# Patient Record
Sex: Male | Born: 1980 | Race: White | Hispanic: No | Marital: Single | Smoking: Current every day smoker
Health system: Southern US, Community
[De-identification: ages and names within clinical notes are randomized; demographics above are authoritative.]

## PROBLEM LIST (undated history)

## (undated) HISTORY — PX: SHOULDER SURGERY: SHX246

---

## 2006-10-13 ENCOUNTER — Emergency Department (HOSPITAL_COMMUNITY): Admission: EM | Admit: 2006-10-13 | Discharge: 2006-10-14 | Payer: Self-pay | Admitting: Emergency Medicine

## 2009-01-03 ENCOUNTER — Emergency Department (HOSPITAL_COMMUNITY): Admission: EM | Admit: 2009-01-03 | Discharge: 2009-01-03 | Payer: Self-pay | Admitting: Family Medicine

## 2009-07-07 ENCOUNTER — Emergency Department (HOSPITAL_COMMUNITY): Admission: EM | Admit: 2009-07-07 | Discharge: 2009-07-07 | Payer: Self-pay | Admitting: Family Medicine

## 2010-01-11 ENCOUNTER — Emergency Department (HOSPITAL_COMMUNITY): Admission: EM | Admit: 2010-01-11 | Discharge: 2010-01-11 | Payer: Self-pay | Admitting: Emergency Medicine

## 2010-01-15 ENCOUNTER — Encounter: Admission: RE | Admit: 2010-01-15 | Discharge: 2010-01-15 | Payer: Self-pay | Admitting: Internal Medicine

## 2013-07-28 ENCOUNTER — Emergency Department (HOSPITAL_COMMUNITY)
Admission: EM | Admit: 2013-07-28 | Discharge: 2013-07-28 | Disposition: A | Discharge status: Home / Self Care | Payer: BC Managed Care – PPO | Attending: Emergency Medicine | Admitting: Emergency Medicine

## 2013-07-28 ENCOUNTER — Encounter (HOSPITAL_COMMUNITY): Payer: Self-pay | Admitting: Emergency Medicine

## 2013-07-28 ENCOUNTER — Emergency Department (HOSPITAL_COMMUNITY): Payer: BC Managed Care – PPO

## 2013-07-28 DIAGNOSIS — S0180XA Unspecified open wound of other part of head, initial encounter: Secondary | ICD-10-CM | POA: Insufficient documentation

## 2013-07-28 DIAGNOSIS — F17200 Nicotine dependence, unspecified, uncomplicated: Secondary | ICD-10-CM | POA: Insufficient documentation

## 2013-07-28 DIAGNOSIS — S0993XA Unspecified injury of face, initial encounter: Secondary | ICD-10-CM | POA: Insufficient documentation

## 2013-07-28 DIAGNOSIS — S01112A Laceration without foreign body of left eyelid and periocular area, initial encounter: Secondary | ICD-10-CM

## 2013-07-28 DIAGNOSIS — Y9389 Activity, other specified: Secondary | ICD-10-CM | POA: Insufficient documentation

## 2013-07-28 DIAGNOSIS — R519 Headache, unspecified: Secondary | ICD-10-CM

## 2013-07-28 DIAGNOSIS — Y92410 Unspecified street and highway as the place of occurrence of the external cause: Secondary | ICD-10-CM | POA: Insufficient documentation

## 2013-07-28 MED ORDER — IBUPROFEN 800 MG PO TABS: 800.0000 mg | ORAL_TABLET | Freq: Once | ORAL | Status: DC

## 2013-07-28 NOTE — ED Provider Notes (Signed)
Medical screening examination/treatment/procedure(s) were performed by non-physician practitioner and as supervising physician I was immediately available for consultation/collaboration.  EKG Interpretation   None         Glynn Octave, MD 07/28/13 [phone]
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
## 2013-07-28 NOTE — ED Notes (Signed)
Patient with laceration above left eye and swollen cheek.  Vision: Left eye: 20/25 Right eye: 20/30 Both eyes: 20/20

## 2015-04-07 ENCOUNTER — Ambulatory Visit (INDEPENDENT_AMBULATORY_CARE_PROVIDER_SITE_OTHER): Payer: BLUE CROSS/BLUE SHIELD

## 2015-04-07 ENCOUNTER — Ambulatory Visit (INDEPENDENT_AMBULATORY_CARE_PROVIDER_SITE_OTHER): Payer: BLUE CROSS/BLUE SHIELD | Admitting: Emergency Medicine

## 2015-04-07 VITALS — BP 140/80 | HR 82 | Temp 97.3°F | Resp 16 | Ht 69.0 in | Wt 223.0 lb

## 2015-04-07 DIAGNOSIS — M25531 Pain in right wrist: Secondary | ICD-10-CM | POA: Diagnosis not present

## 2015-04-07 DIAGNOSIS — M654 Radial styloid tenosynovitis [de Quervain]: Secondary | ICD-10-CM

## 2015-04-07 MED ORDER — NAPROXEN SODIUM 550 MG PO TABS: 550.0000 mg | ORAL_TABLET | Freq: Two times a day (BID) | ORAL | Status: AC

## 2015-04-07 NOTE — Progress Notes (Signed)
PROCEDURE NOTE: Thumb Spica Splint Verbal consent obtained. A 3" x 8" thumb spica splint was applied to patient's right thumb with wrist in slightly dorsiflexed position. Secured with 2 Ace wraps. Patient tolerated this well.  Wallis Bamberg, PA-C [facility] [phone] 04/07/2015  10:43 AM

## 2015-04-07 NOTE — Patient Instructions (Signed)
De Quervain's Tenosynovitis De Quervain's tenosynovitis involves inflammation of one or two tendon linings (sheaths) or strain of one or two tendons to the thumb: extensor pollicis brevis (EPB), or abductor pollicis longus (APL). This causes pain on the side of the wrist and base of the thumb. Tendon sheaths secrete a fluid that lubricates the tendon, allowing the tendon to move smoothly. When the sheath becomes inflamed, the tendon cannot move freely in the sheath. Both the EPB and APL tendons are important for proper use of the hand. The EPB tendon is important for straightening the thumb. The APL tendon is important for moving the thumb away from the index finger (abducting). The two tendons pass through a small tube (canal) in the wrist, near the base of the thumb. When the tendons become inflamed, pain is usually felt in this area. SYMPTOMS   Pain, tenderness, swelling, warmth, or redness over the base of the thumb and thumb side of the wrist.  Pain that gets worse when straightening the thumb.  Pain that gets worse when moving the thumb away from the index finger, against resistance.  Pain with pinching or gripping.  Locking or catching of the thumb.  Limited motion of the thumb.  Crackling sound (crepitation) when the tendon or thumb is moved or touched.  Fluid-filled cyst in the area of the base of the thumb. CAUSES   Tenosynovitis is often linked with overuse of the wrist.  Tenosynovitis may be caused by repeated injury to the thumb muscle and tendon units, and with repeated motions of the hand and wrist, due to friction of the tendon within the lining (sheath).  Tenosynovitis may also be due to a sudden increase in activity or change in activity. RISK INCREASES WITH:  Sports that involve repeated hand and wrist motions (golf, bowling, tennis, squash, racquetball).  Heavy labor.  Poor physical wrist strength and flexibility.  Failure to warm up properly before practice or  play.  Male gender.  New mothers who hold their baby's head for long periods or lift infants with thumbs in the infant's armpit (axilla). PREVENTION  Warm up and stretch properly before practice or competition.  Allow enough time for rest and recovery between practices and competition.  Maintain appropriate conditioning:  Cardiovascular fitness.  Forearm, wrist, and hand flexibility.  Muscle strength and endurance.  Use proper exercise technique. PROGNOSIS  This condition is usually curable within 6 weeks, if treated properly with non-surgical treatment and resting of the affected area.  RELATED COMPLICATIONS   Longer healing time if not properly treated or if not given enough time to heal.  Chronic inflammation, causing recurring symptoms of tenosynovitis. Permanent pain or restriction of movement.  Risks of surgery: infection, bleeding, injury to nerves (numbness of the thumb), continued pain, incomplete release of the tendon sheath, recurring symptoms, cutting of the tendons, tendons sliding out of position, weakness of the thumb, thumb stiffness. TREATMENT  First, treatment involves the use of medicine and ice, to reduce pain and inflammation. Patients are encouraged to stop or modify activities that aggravate the injury. Stretching and strengthening exercises may be advised. Exercises may be completed at home or with a therapist. You may be fitted with a brace or splint, to limit motion and allow the injury to heal. Your caregiver may also choose to give you a corticosteroid injection, to reduce the pain and inflammation. If non-surgical treatment is not successful, surgery may be needed. Most tenosynovitis surgeries are done as outpatient procedures (you go home the   same day). Surgery may involve local, regional (whole arm), or general anesthesia.  MEDICATION   If pain medicine is needed, nonsteroidal anti-inflammatory medicines (aspirin and ibuprofen), or other minor pain  relievers (acetaminophen), are often advised.  Do not take pain medicine for 7 days before surgery.  Prescription pain relievers are often prescribed only after surgery. Use only as directed and only as much as you need.  Corticosteroid injections may be given if your caregiver thinks they are needed. There is a limited number of times these injections may be given. COLD THERAPY   Cold treatment (icing) should be applied for 10 to 15 minutes every 2 to 3 hours for inflammation and pain, and immediately after activity that aggravates your symptoms. Use ice packs or an ice massage. SEEK MEDICAL CARE IF:   Symptoms get worse or do not improve in 2 to 4 weeks, despite treatment.  You experience pain, numbness, or coldness in the hand.  Blue, gray, or dark color appears in the fingernails.  Any of the following occur after surgery: increased pain, swelling, redness, drainage of fluids, bleeding in the affected area, or signs of infection.  New, unexplained symptoms develop. (Drugs used in treatment may produce side effects.) Document Released: 07/22/2005 Document Revised: 10/14/2011 Document Reviewed: 11/03/2008 ExitCare Patient Information 2015 [employer]. This information is not intended to replace advice given to you by your health care provider. Make sure you discuss any questions you have with your health care provider.   
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
## 2015-04-09 ENCOUNTER — Ambulatory Visit (INDEPENDENT_AMBULATORY_CARE_PROVIDER_SITE_OTHER): Payer: BLUE CROSS/BLUE SHIELD | Admitting: Family Medicine

## 2015-04-09 ENCOUNTER — Telehealth: Payer: Self-pay | Admitting: Family Medicine

## 2015-04-09 VITALS — BP 114/86 | HR 67 | Temp 97.5°F | Resp 16 | Ht 69.0 in | Wt 226.0 lb

## 2015-04-09 DIAGNOSIS — M7989 Other specified soft tissue disorders: Secondary | ICD-10-CM

## 2015-04-09 DIAGNOSIS — M654 Radial styloid tenosynovitis [de Quervain]: Secondary | ICD-10-CM | POA: Diagnosis not present
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
## 2015-04-09 NOTE — Telephone Encounter (Signed)
Patient called answering service sayingthat ace wrap slpint was causing his thumb and wrist to swell. Advise to take off splint, he can come into the office tomorrow for a thumb spica splint if he has dequervain's tendonitis. If the swelling continues with elevation overnight then he needs to check in for an office visit otherwise he can just pick up a splint since the hand made one in office was actually helping. He denies n/w/t, he just can't close his hand due to swelling. No fevers or chills.

## 2015-04-09 NOTE — Patient Instructions (Signed)
De Quervain's Tenosynovitis De Quervain's tenosynovitis involves inflammation of one or two tendon linings (sheaths) or strain of one or two tendons to the thumb: extensor pollicis brevis (EPB), or abductor pollicis longus (APL). This causes pain on the side of the wrist and base of the thumb. Tendon sheaths secrete a fluid that lubricates the tendon, allowing the tendon to move smoothly. When the sheath becomes inflamed, the tendon cannot move freely in the sheath. Both the EPB and APL tendons are important for proper use of the hand. The EPB tendon is important for straightening the thumb. The APL tendon is important for moving the thumb away from the index finger (abducting). The two tendons pass through a small tube (canal) in the wrist, near the base of the thumb. When the tendons become inflamed, pain is usually felt in this area. SYMPTOMS   Pain, tenderness, swelling, warmth, or redness over the base of the thumb and thumb side of the wrist.  Pain that gets worse when straightening the thumb.  Pain that gets worse when moving the thumb away from the index finger, against resistance.  Pain with pinching or gripping.  Locking or catching of the thumb.  Limited motion of the thumb.  Crackling sound (crepitation) when the tendon or thumb is moved or touched.  Fluid-filled cyst in the area of the base of the thumb. CAUSES   Tenosynovitis is often linked with overuse of the wrist.  Tenosynovitis may be caused by repeated injury to the thumb muscle and tendon units, and with repeated motions of the hand and wrist, due to friction of the tendon within the lining (sheath).  Tenosynovitis may also be due to a sudden increase in activity or change in activity. RISK INCREASES WITH:  Sports that involve repeated hand and wrist motions (golf, bowling, tennis, squash, racquetball).  Heavy labor.  Poor physical wrist strength and flexibility.  Failure to warm up properly before practice or  play.  Male gender.  New mothers who hold their baby's head for long periods or lift infants with thumbs in the infant's armpit (axilla). PREVENTION  Warm up and stretch properly before practice or competition.  Allow enough time for rest and recovery between practices and competition.  Maintain appropriate conditioning:  Cardiovascular fitness.  Forearm, wrist, and hand flexibility.  Muscle strength and endurance.  Use proper exercise technique. PROGNOSIS  This condition is usually curable within 6 weeks, if treated properly with non-surgical treatment and resting of the affected area.  RELATED COMPLICATIONS   Longer healing time if not properly treated or if not given enough time to heal.  Chronic inflammation, causing recurring symptoms of tenosynovitis. Permanent pain or restriction of movement.  Risks of surgery: infection, bleeding, injury to nerves (numbness of the thumb), continued pain, incomplete release of the tendon sheath, recurring symptoms, cutting of the tendons, tendons sliding out of position, weakness of the thumb, thumb stiffness. TREATMENT  First, treatment involves the use of medicine and ice, to reduce pain and inflammation. Patients are encouraged to stop or modify activities that aggravate the injury. Stretching and strengthening exercises may be advised. Exercises may be completed at home or with a therapist. You may be fitted with a brace or splint, to limit motion and allow the injury to heal. Your caregiver may also choose to give you a corticosteroid injection, to reduce the pain and inflammation. If non-surgical treatment is not successful, surgery may be needed. Most tenosynovitis surgeries are done as outpatient procedures (you go home the   same day). Surgery may involve local, regional (whole arm), or general anesthesia.  MEDICATION   If pain medicine is needed, nonsteroidal anti-inflammatory medicines (aspirin and ibuprofen), or other minor pain  relievers (acetaminophen), are often advised.  Do not take pain medicine for 7 days before surgery.  Prescription pain relievers are often prescribed only after surgery. Use only as directed and only as much as you need.  Corticosteroid injections may be given if your caregiver thinks they are needed. There is a limited number of times these injections may be given. COLD THERAPY   Cold treatment (icing) should be applied for 10 to 15 minutes every 2 to 3 hours for inflammation and pain, and immediately after activity that aggravates your symptoms. Use ice packs or an ice massage. SEEK MEDICAL CARE IF:   Symptoms get worse or do not improve in 2 to 4 weeks, despite treatment.  You experience pain, numbness, or coldness in the hand.  Blue, gray, or dark color appears in the fingernails.  Any of the following occur after surgery: increased pain, swelling, redness, drainage of fluids, bleeding in the affected area, or signs of infection.  New, unexplained symptoms develop. (Drugs used in treatment may produce side effects.) Document Released: 07/22/2005 Document Revised: 10/14/2011 Document Reviewed: 11/03/2008 ExitCare Patient Information 2015 [employer]. This information is not intended to replace advice given to you by your health care provider. Make sure you discuss any questions you have with your health care provider.   

## 2015-04-19 ENCOUNTER — Telehealth: Payer: Self-pay

## 2015-04-19 NOTE — Telephone Encounter (Signed)
Pt has an upcoming appt with specialist for wrist on September 22,Is he to continue wearing brace until that appt and if so, He will need note for work for restrictions etc   Best phone for pt is [phone]

## 2015-04-21 NOTE — Telephone Encounter (Signed)
Please advise 

## 2015-04-21 NOTE — Telephone Encounter (Signed)
I apologize, I meant to send this message 2 days ago. Something happened. Please advise.

## 2015-04-22 NOTE — Telephone Encounter (Signed)
Call for update --- how is patient's wrist pain?  Is it better, the same, or worse?

## 2015-04-24 NOTE — Telephone Encounter (Signed)
Left message for pt to call back  °

## 2015-04-25 NOTE — Telephone Encounter (Signed)
Call --- if wrist continues to hurt atleast every other day, I would continue using the wrist splint while at work. If wrist rarely hurts now, then would not always use wrist splint.

## 2015-04-25 NOTE — Telephone Encounter (Signed)
Spoke with pt, he states his wrist is better but started hurting last night at work. He just wanted to know if he had to use his brace until his appt with ortho. He has an appt on Thursday.

## 2015-04-26 NOTE — Telephone Encounter (Signed)
Spoke with Pt. And he is going to continue to wear his wrist splint at work. He did state he still in pain, and will be going to his appt. Tomorrow at ortho.

## 2016-03-13 ENCOUNTER — Emergency Department (HOSPITAL_COMMUNITY)
Admission: EM | Admit: 2016-03-13 | Discharge: 2016-03-13 | Disposition: A | Discharge status: Home / Self Care | Payer: Worker's Compensation | Attending: Emergency Medicine | Admitting: Emergency Medicine

## 2016-03-13 ENCOUNTER — Emergency Department (HOSPITAL_COMMUNITY): Payer: Worker's Compensation

## 2016-03-13 ENCOUNTER — Encounter (HOSPITAL_COMMUNITY): Payer: Self-pay

## 2016-03-13 DIAGNOSIS — Y939 Activity, unspecified: Secondary | ICD-10-CM | POA: Insufficient documentation

## 2016-03-13 DIAGNOSIS — S61231A Puncture wound without foreign body of left index finger without damage to nail, initial encounter: Secondary | ICD-10-CM | POA: Insufficient documentation

## 2016-03-13 DIAGNOSIS — S6992XA Unspecified injury of left wrist, hand and finger(s), initial encounter: Secondary | ICD-10-CM

## 2016-03-13 DIAGNOSIS — F17210 Nicotine dependence, cigarettes, uncomplicated: Secondary | ICD-10-CM | POA: Diagnosis not present

## 2016-03-13 DIAGNOSIS — Z23 Encounter for immunization: Secondary | ICD-10-CM | POA: Insufficient documentation

## 2016-03-13 DIAGNOSIS — Y929 Unspecified place or not applicable: Secondary | ICD-10-CM | POA: Diagnosis not present

## 2016-03-13 DIAGNOSIS — Y990 Civilian activity done for income or pay: Secondary | ICD-10-CM | POA: Diagnosis not present

## 2016-03-13 DIAGNOSIS — W231XXA Caught, crushed, jammed, or pinched between stationary objects, initial encounter: Secondary | ICD-10-CM | POA: Diagnosis not present

## 2016-03-13 MED ORDER — TETANUS-DIPHTH-ACELL PERTUSSIS 5-2.5-18.5 LF-MCG/0.5 IM SUSP
0.5000 mL | Freq: Once | INTRAMUSCULAR | Status: AC
  Administered 2016-03-13: 0.5 mL via INTRAMUSCULAR
  Filled 2016-03-13: qty 0.5

## 2016-03-13 MED ORDER — SULFAMETHOXAZOLE-TRIMETHOPRIM 800-160 MG PO TABS: 10.0000 | ORAL_TABLET | Freq: Two times a day (BID) | ORAL | 0 refills | Status: AC

## 2016-03-13 MED ORDER — SULFAMETHOXAZOLE-TRIMETHOPRIM 800-160 MG PO TABS: 1.0000 | ORAL_TABLET | Freq: Two times a day (BID) | ORAL | 0 refills | Status: AC

## 2016-03-13 NOTE — ED Triage Notes (Signed)
Pt here for crush injury to left pointer finger at work when reel fell on it.

## 2016-03-13 NOTE — ED Provider Notes (Signed)
[facility] Provider Note   CSN: [id number] Arrival date & time: 03/13/16  4098  First Provider Contact:  None       History   Chief Complaint Chief Complaint  Patient presents with  . Finger Injury    HPI JAYMARION TROMBLY is a 35 y.o. male.  Patient presents today with an injury to the left index finger.  He states that he was working with some sort of a rod approximately 2 hours prior to arrival.  He was trying to put the rod into a PVC pipe when it slipped and landed on his finger.  He does not believe that anything went through his finger or broke off inside his finger.  He denies any numbness or tingling of the finger.  He has a small puncture wound to the dorsal and palmar aspect of the fingers.  Bleeding controlled.  He is unsure of the date of his last tetanus.       History reviewed. No pertinent past medical history.  There are no active problems to display for this patient.   Past Surgical History:  Procedure Laterality Date  . SHOULDER SURGERY         Home Medications    Prior to Admission medications   Medication Sig Start Date End Date Taking? Authorizing Provider  naproxen sodium (ANAPROX DS) 550 MG tablet Take 1 tablet (550 mg total) by mouth 2 (two) times daily with a meal. 04/07/15 04/06/16  Carmelina Dane, MD    Family History History reviewed. No pertinent family history.  Social History Social History  Substance Use Topics  . Smoking status: Current Every Day Smoker    Types: Cigarettes  . Smokeless tobacco: Current User     Comment: Vape  . Alcohol use No     Allergies   Review of patient's allergies indicates no known allergies.   Review of Systems Review of Systems  All other systems reviewed and are negative.    Physical Exam Updated Vital Signs BP 117/64   Pulse 74   Temp 97.8 F (36.6 C) (Oral)   Resp 18   Ht  (1.727 m)   Wt 97.5 kg   SpO2 98%   BMI 32.69 kg/m   Physical Exam  Constitutional: He  appears well-developed and well-nourished.  HENT:  Head: Normocephalic and atraumatic.  Neck: Normal range of motion. Neck supple.  Cardiovascular: Normal rate and regular rhythm.   Pulmonary/Chest: Effort normal and breath sounds normal.  Musculoskeletal: Normal range of motion.  Full ROM of the left index finger  Neurological: He is alert.  Distal sensation of the left index finger is intact  Skin: Skin is warm and dry.  Small puncture wound to the dorsal and volar aspect of the left index finger.  No active bleeding.  No drainage.  No surrounding erythema or warmth  Psychiatric: He has a normal mood and affect.  Nursing note and vitals reviewed.    ED Treatments / Results  Labs (all labs ordered are listed, but only abnormal results are displayed) Labs Reviewed - No data to display  EKG  EKG Interpretation None       Radiology Dg Finger Index Left  Result Date: 03/13/2016 CLINICAL DATA:  Reel fell on patient's left index finger, with pain. Initial encounter. EXAM: LEFT INDEX FINGER 2+V COMPARISON:  None. FINDINGS: There is no evidence of fracture or dislocation. There appears to be a small 4 mm high-density thin fragment embedded within the  soft tissues along the volar aspect of the second distal interphalangeal joint, which may reflect a thin wire. Visualized joint spaces are grossly preserved. No additional soft tissue abnormalities are seen. IMPRESSION: 1. No evidence of fracture or dislocation. 2. Apparent small 4 mm high-density thin fragment embedded within the soft tissues along the volar aspect of the second distal interphalangeal joint, which may reflect a thin wire. Electronically Signed   By: Jeffery  Chang M.D.   On: 03/13/2016 05:Roanna [id number]    Procedures Procedures (including critical care time)  Medications Ordered in ED Medications  Tdap (BOOSTRIX) injection 0.5 mL (not administered)     Initial Impression / Assessment and Plan / ED Course  I have reviewed the  triage vital signs and the nursing notes.  Pertinent labs & imaging results that were available during my care of the patient were reviewed by me and considered in my medical decision making (see chart for details).  Clinical Course    Patient presents today with a chief complaint of finger injury.  He states that some sort of reel fell on his finger approximately 2 hours prior to arrival.  He does have what appears to be a puncture wound to the volar and dorsal aspect of the finger.  He does not feel that anything went inside of his finger.  Xray showing small 4mm fragment in the soft tissues near the DIP. Will refer to hand.  Patient placed on antibiotic.  Tetanus updated.  Stable for discharge.  Return precautions given.    Final Clinical Impressions(s) / ED Diagnoses   Final diagnoses:  None    New Prescriptions New Prescriptions   No medications on file     Kristie CowmanHeather Killian Schwer, PA-C 03/14/16 2147    Alvira MondayErin Schlossman, MD 03/15/16 Rickey [id number]

## 2016-05-03 DIAGNOSIS — Z0000 Encounter for general adult medical examination without abnormal findings: Secondary | ICD-10-CM | POA: Diagnosis not present

## 2016-05-10 DIAGNOSIS — Z1389 Encounter for screening for other disorder: Secondary | ICD-10-CM | POA: Diagnosis not present

## 2016-05-10 DIAGNOSIS — R945 Abnormal results of liver function studies: Secondary | ICD-10-CM | POA: Diagnosis not present

## 2016-05-10 DIAGNOSIS — R030 Elevated blood-pressure reading, without diagnosis of hypertension: Secondary | ICD-10-CM | POA: Diagnosis not present

## 2016-05-10 DIAGNOSIS — M25519 Pain in unspecified shoulder: Secondary | ICD-10-CM | POA: Diagnosis not present

## 2016-05-10 DIAGNOSIS — Z0000 Encounter for general adult medical examination without abnormal findings: Secondary | ICD-10-CM | POA: Diagnosis not present

## 2016-05-10 DIAGNOSIS — J351 Hypertrophy of tonsils: Secondary | ICD-10-CM | POA: Diagnosis not present

## 2016-05-23 ENCOUNTER — Ambulatory Visit (INDEPENDENT_AMBULATORY_CARE_PROVIDER_SITE_OTHER): Payer: BLUE CROSS/BLUE SHIELD

## 2016-05-23 ENCOUNTER — Ambulatory Visit (INDEPENDENT_AMBULATORY_CARE_PROVIDER_SITE_OTHER): Payer: BLUE CROSS/BLUE SHIELD | Admitting: Podiatry

## 2016-05-23 ENCOUNTER — Encounter: Payer: Self-pay | Admitting: Podiatry

## 2016-05-23 DIAGNOSIS — S99921A Unspecified injury of right foot, initial encounter: Secondary | ICD-10-CM

## 2016-05-23 DIAGNOSIS — M779 Enthesopathy, unspecified: Secondary | ICD-10-CM

## 2016-05-23 MED ORDER — TRIAMCINOLONE ACETONIDE 10 MG/ML IJ SUSP
10.0000 mg | Freq: Once | INTRAMUSCULAR | Status: AC
  Administered 2016-05-23: 10 mg

## 2016-05-23 NOTE — Progress Notes (Signed)
   Subjective:    Patient ID: Rickey Lee, male    DOB: 05/18/1981, 35 y.o.   MRN: [id number]  HPI Chief Complaint  Patient presents with  . Foot Injury    Medial foot and anterior ankle right - patient was playing around with a self standing ladder and while climbing it felt something pop on Friday (6 days ago) and a lot of pain, iced it-no help      Review of Systems  Musculoskeletal: Positive for myalgias.  All other systems reviewed and are negative.      Objective:   Physical Exam        Assessment & Plan:

## 2016-05-29 NOTE — Progress Notes (Signed)
Subjective:     Patient ID: Joselyn ArrowKevin X Biller, male   DOB: 1980/09/13, 35 y.o.   MRN: [id number]  HPI patient presents stating that he hurt his right ankle 6 days ago and felt a pop when he was on a ladder and wanted to make sure there was no damage   Review of Systems  All other systems reviewed and are negative.      Objective:   Physical Exam  Constitutional: He is oriented to person, place, and time.  Cardiovascular: Intact distal pulses.   Musculoskeletal: Normal range of motion.  Neurological: He is oriented to person, place, and time.  Skin: Skin is warm.  Nursing note and vitals reviewed.  neurovascular status was intact muscle strength was adequate range of motion within normal limits with discomfort in the lateral ankle area posteriorly of a mild nature. I checked all ligaments and all muscles and found everything to be intact and do not think that there is a tear but I do think that there is an inflammatory process. It is localized in nature with patient having good digital perfusion and well oriented 3     Assessment:     Probable inflammatory tendinitis which may be from injury or more localized process    Plan:     H&P x-rays reviewed and advised on sheath injection which was administered along with bracing to help stabilize the area and aggressive ice. Reappoint next 3 weeks to recheck  X-rays were negative for signs of fracture or arthritic process

## 2016-06-29 DIAGNOSIS — H52223 Regular astigmatism, bilateral: Secondary | ICD-10-CM | POA: Diagnosis not present

## 2017-03-06 ENCOUNTER — Encounter (HOSPITAL_COMMUNITY): Payer: Self-pay | Admitting: Emergency Medicine

## 2017-03-06 DIAGNOSIS — F17210 Nicotine dependence, cigarettes, uncomplicated: Secondary | ICD-10-CM | POA: Diagnosis not present

## 2017-03-06 DIAGNOSIS — Y9289 Other specified places as the place of occurrence of the external cause: Secondary | ICD-10-CM | POA: Insufficient documentation

## 2017-03-06 DIAGNOSIS — S6991XA Unspecified injury of right wrist, hand and finger(s), initial encounter: Secondary | ICD-10-CM | POA: Diagnosis present

## 2017-03-06 DIAGNOSIS — Y990 Civilian activity done for income or pay: Secondary | ICD-10-CM | POA: Insufficient documentation

## 2017-03-06 DIAGNOSIS — W230XXA Caught, crushed, jammed, or pinched between moving objects, initial encounter: Secondary | ICD-10-CM | POA: Insufficient documentation

## 2017-03-06 DIAGNOSIS — Y9389 Activity, other specified: Secondary | ICD-10-CM | POA: Diagnosis not present

## 2017-03-06 MED ORDER — IBUPROFEN 400 MG PO TABS
400.0000 mg | ORAL_TABLET | Freq: Once | ORAL | Status: AC | PRN
  Administered 2017-03-06: 400 mg via ORAL

## 2017-03-06 MED ORDER — IBUPROFEN 400 MG PO TABS
ORAL_TABLET | ORAL | Status: AC
  Filled 2017-03-06: qty 1

## 2017-03-06 NOTE — ED Triage Notes (Signed)
Pt states he got his hand stuck in a puller while at work tonight. Laceration to right pinky, bruising to right thumb.

## 2017-03-07 ENCOUNTER — Emergency Department (HOSPITAL_COMMUNITY)
Admission: EM | Admit: 2017-03-07 | Discharge: 2017-03-07 | Disposition: A | Discharge status: Home / Self Care | Payer: Worker's Compensation | Attending: Emergency Medicine | Admitting: Emergency Medicine

## 2017-03-07 ENCOUNTER — Encounter (HOSPITAL_COMMUNITY): Payer: Self-pay | Admitting: Emergency Medicine

## 2017-03-07 ENCOUNTER — Emergency Department (HOSPITAL_COMMUNITY): Payer: Worker's Compensation

## 2017-03-07 DIAGNOSIS — S6991XA Unspecified injury of right wrist, hand and finger(s), initial encounter: Secondary | ICD-10-CM

## 2017-03-07 MED ORDER — IBUPROFEN 600 MG PO TABS: 600.0000 mg | ORAL_TABLET | Freq: Four times a day (QID) | ORAL | 0 refills | Status: DC | PRN

## 2017-03-07 NOTE — Discharge Instructions (Signed)
You have been seen today for a hand injury. There were no acute abnormalities on the x-rays, including no sign of fracture or dislocation. Pain: Take 600 mg of ibuprofen every 6 hours or 440 mg (over the counter dose) to 500 mg (prescription dose) of naproxen every 12 hours or for the next 3 days. After this time, these medications may be used as needed for pain. Take these medications with food to avoid upset stomach. Choose only one of these medications, do not take them together.  Tylenol: Should you continue to have additional pain while taking the ibuprofen or naproxen, you may add in tylenol as needed. Your daily total maximum amount of tylenol from all sources should be limited to 4000mg /day for persons without liver problems, or 2000mg /day for those with liver problems. Ice: May apply ice to the area over the next 24 hours for 15 minutes at a time to reduce swelling. Elevation: Keep the extremity elevated as often as possible to reduce pain and inflammation. Exercises: Start by performing these exercises a few times a week, increasing the frequency until you are performing them twice daily.  Follow up: If symptoms are improving, you may follow up with your primary care provider for any continued management. If symptoms are not improving, you may follow up with the hand specialist.

## 2017-03-07 NOTE — ED Provider Notes (Signed)
[facility] Provider Note   CSN: [id number] Arrival date & time: 03/06/17  2324     History   Chief Complaint Chief Complaint  Patient presents with  . Hand Injury    HPI Rickey Lee is a 36 y.o. male.  HPI   Rickey ArrowKevin X Chason is a 36 y.o. male, patient with no pertinent past medical history, presenting to the ED with a right hand injury that occurred around 11pm 8/2.  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 He complains of 6/10, throbbing, nonradiating. Also complains of swelling to the fingers of the right hand.  Denies numbness, tingling, weakness, other injuries, or any other complaints.     History reviewed. No pertinent past medical history.  There are no active problems to display for this patient.   Past Surgical History:  Procedure Laterality Date  . SHOULDER SURGERY         Home Medications    Prior to Admission medications   Medication Sig Start Date End Date Taking? Authorizing Provider  ibuprofen (ADVIL,MOTRIN) 600 MG tablet Take 1 tablet (600 mg total) by mouth every 6 (six) hours as needed. 03/07/17   Joy, Hillard DankerShawn C, PA-C    Family History No family history on file.  Social History Social History  Substance Use Topics  . Smoking status: Current Every Day Smoker    Types: Cigarettes  . Smokeless tobacco: Current User     Comment: Vape  . Alcohol use No     Allergies   Patient has no known allergies.   Review of Systems Review of Systems  Musculoskeletal: Positive for arthralgias.  Skin: Positive for wound.  Neurological: Negative for weakness and numbness.     Physical Exam Updated Vital Signs BP (!) 154/104 (BP Location: Left Arm)   Pulse 74   Temp (!) 97.4 F (36.3 C) (Oral)   Resp 18   Ht 5\' 8"  (1.727 m)   Wt 95.3 kg (210 lb)   SpO2 100%   BMI 31.93 kg/m   Physical Exam  Constitutional: He appears well-developed and well-nourished. No distress.  HENT:  Head:  Normocephalic and atraumatic.  Eyes: Conjunctivae are normal.  Neck: Neck supple.  Cardiovascular: Normal rate, regular rhythm and intact distal pulses.   Pulmonary/Chest: Effort normal.  Musculoskeletal: He exhibits edema and tenderness.  Tenderness and swelling noted to the fingers of the right hand. Flexion and extension intact at the MCP, PIP, and DIP joints of each of the fingers of the right hand. Patient initially had some difficulty with this due to pain, however, once pain was controlled he was able to perform these functions without hesitation. Minor scissoring of the right small finger over the ring finger when making a fist. Full range of motion through each of the cardinal directions of the wrist.  Neurological: He is alert.  No noted sensory deficits in the digits of the right hand. Once pain was controlled, grip strength 5 out of 5. Strength is also 5 out of 5 with flexion and extension of each of the right fingers as well as abduction and abduction of the fingers.  Skin: Skin is warm and dry. Capillary refill takes less than 2 seconds. He is not diaphoretic.  1 cm, superficial skin avulsion to the ulnar surface of the right small finger.  The "foreign bodies" noted on x-ray were found to correlate with 2 very small pieces of gravel sticking to the patient's  skin. These were removed.  Psychiatric: He has a normal mood and affect. His behavior is normal.  Nursing note and vitals reviewed.          ED Treatments / Results  Labs (all labs ordered are listed, but only abnormal results are displayed) Labs Reviewed - No data to display  EKG  EKG Interpretation None       Radiology Dg Hand Complete Right  Result Date: 03/07/2017 CLINICAL DATA:  Initial evaluation for acute trauma, laceration to fingers. EXAM: RIGHT HAND - COMPLETE 3+ VIEW COMPARISON:  None available. FINDINGS: Soft tissue irregularity with swelling seen about the PIP joints of the second through fifth  digits, suspicious for soft tissue injury/ laceration. There is a small 3 mm radiopaque foreign body at the radial aspect of the right third digit, just proximal to the PIP joint. Additional punctate radiopaque density at the skin of the radial aspect of the right second digit near the base. No other radiopaque foreign body. No acute fracture dislocation. 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 IMPRESSION: 1. Soft tissue swelling with irregularity about the PIP joints of the right second through fifth digits, compatible with soft tissue injury/laceration. 2. 2 subcentimeter radiopaque densities at the radial aspects of the proximal right second and third digits as above, consistent with small foreign bodies. 3. No acute fracture or dislocation. Electronically Signed   By: Rise MuBenjamin  McClintock M.D.   On: 03/07/2017 00:36    Procedures .Nerve Block Date/Time: 03/07/2017 1:07 AM Performed by: Anselm PancoastJOY, SHAWN C Authorized by: Anselm PancoastJOY, SHAWN C   Consent:    Consent obtained:  Verbal   Consent given by:  Patient   Risks discussed:  Unsuccessful block, pain and swelling Indications:    Indications:  Pain relief Location:    Body area:  Upper extremity   Upper extremity nerve blocked: Digital.   Laterality:  Right Pre-procedure details:    Skin preparation:  Povidone-iodine Procedure details (see MAR for exact dosages):    Block needle gauge:  25 G   Anesthetic injected:  Bupivacaine 0.5% w/o epi Post-procedure details:    Outcome:  Pain relieved   Patient tolerance of procedure:  Tolerated well, no immediate complications   (including critical care time)  Medications Ordered in ED Medications  ibuprofen (ADVIL,MOTRIN) tablet 400 mg (400 mg Oral Given 03/06/17 2337)     Initial Impression / Assessment and Plan / ED Course  I have reviewed the triage vital signs and the nursing notes.  Pertinent labs & imaging results that were available during my care of the patient were reviewed by  me and considered in my medical decision making (see chart for details).     Patient presents with a right hand injury. Neurovascularly intact. Hand specialist follow-up as needed. The patient was given instructions for home care as well as return precautions. Patient voices understanding of these instructions, accepts the plan, and is comfortable with discharge.  Final Clinical Impressions(s) / ED Diagnoses   Final diagnoses:  Injury of right hand, initial encounter    New Prescriptions Discharge Medication List as of 03/07/2017  3:13 AM    START taking these medications   Details  ibuprofen (ADVIL,MOTRIN) 600 MG tablet Take 1 tablet (600 mg total) by mouth every 6 (six) hours as needed., Starting Fri 03/07/2017, Print         Joy, Shawn C, PA-C 03/07/17 0524    Ward, Layla MawKristen N, DO 03/07/17 [id number]

## 2017-06-14 DIAGNOSIS — H52223 Regular astigmatism, bilateral: Secondary | ICD-10-CM | POA: Diagnosis not present

## 2017-08-05 IMAGING — DX DG HAND COMPLETE 3+V*R*
3 series · 3 of 3 positions shown · non-contrast
Comparison: None available.

CLINICAL DATA: Initial evaluation for acute trauma, laceration to
fingers.

EXAM:
RIGHT HAND - COMPLETE 3+ VIEW

[hand pa]
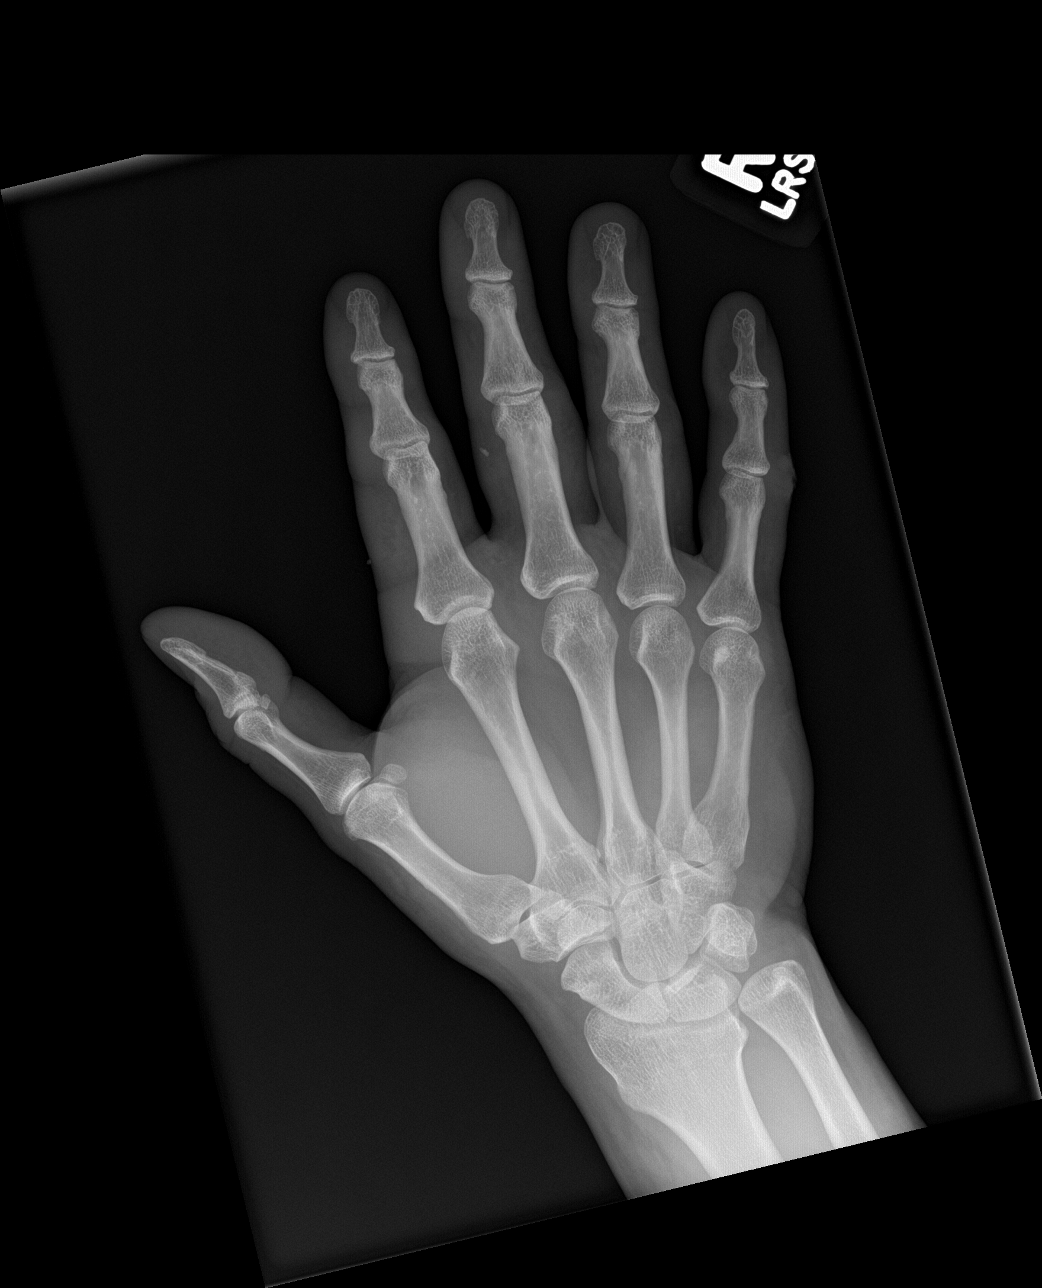

[hand obl]
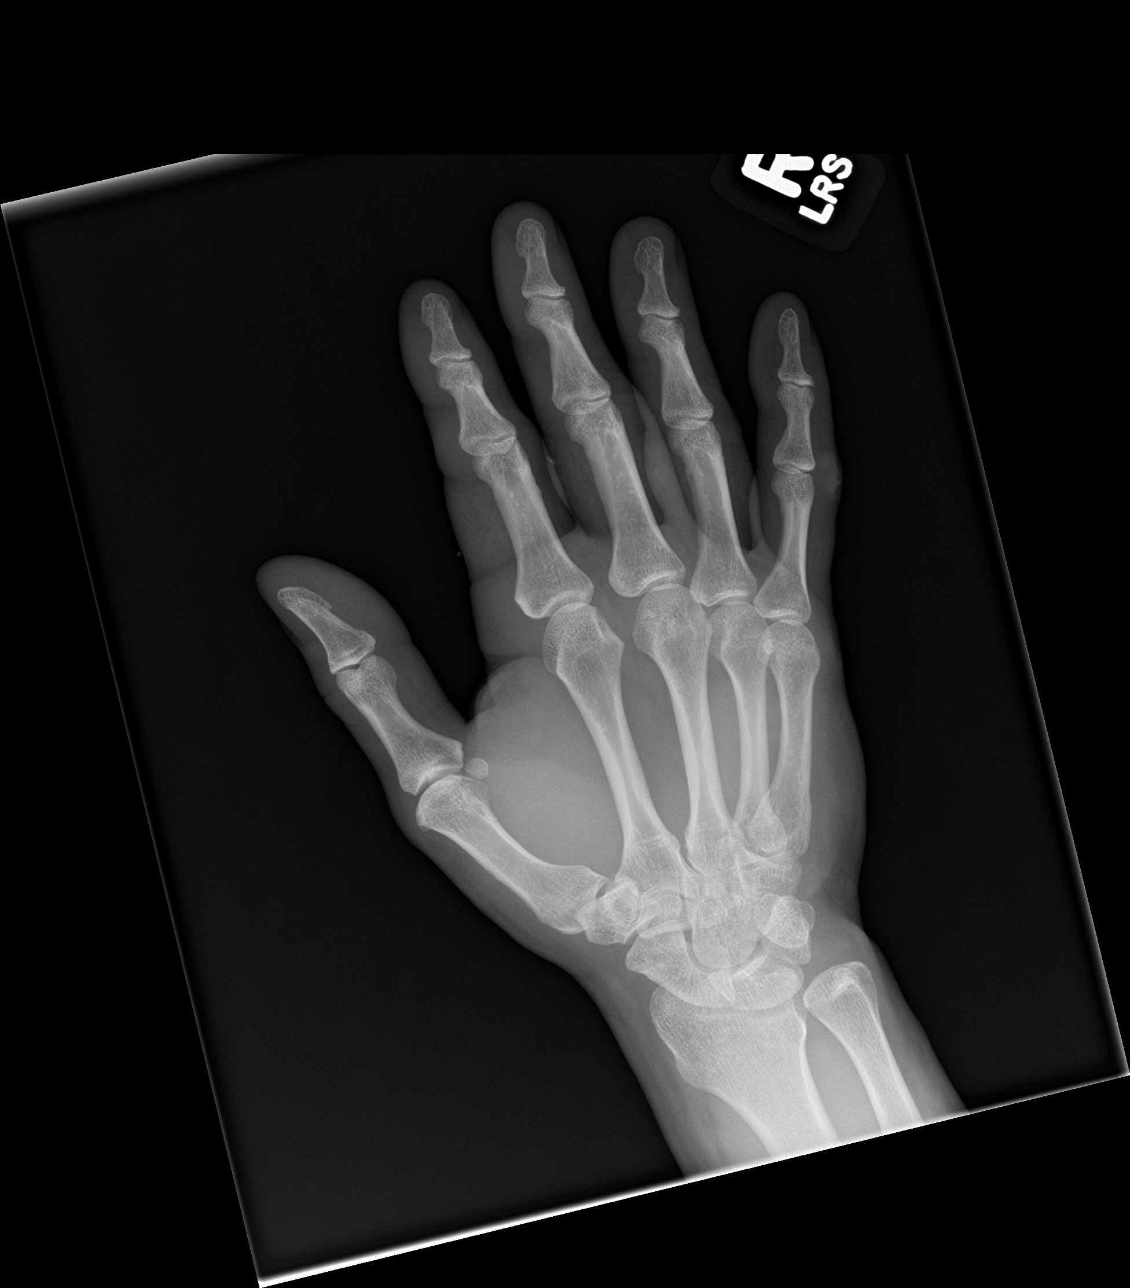

[hand lat]
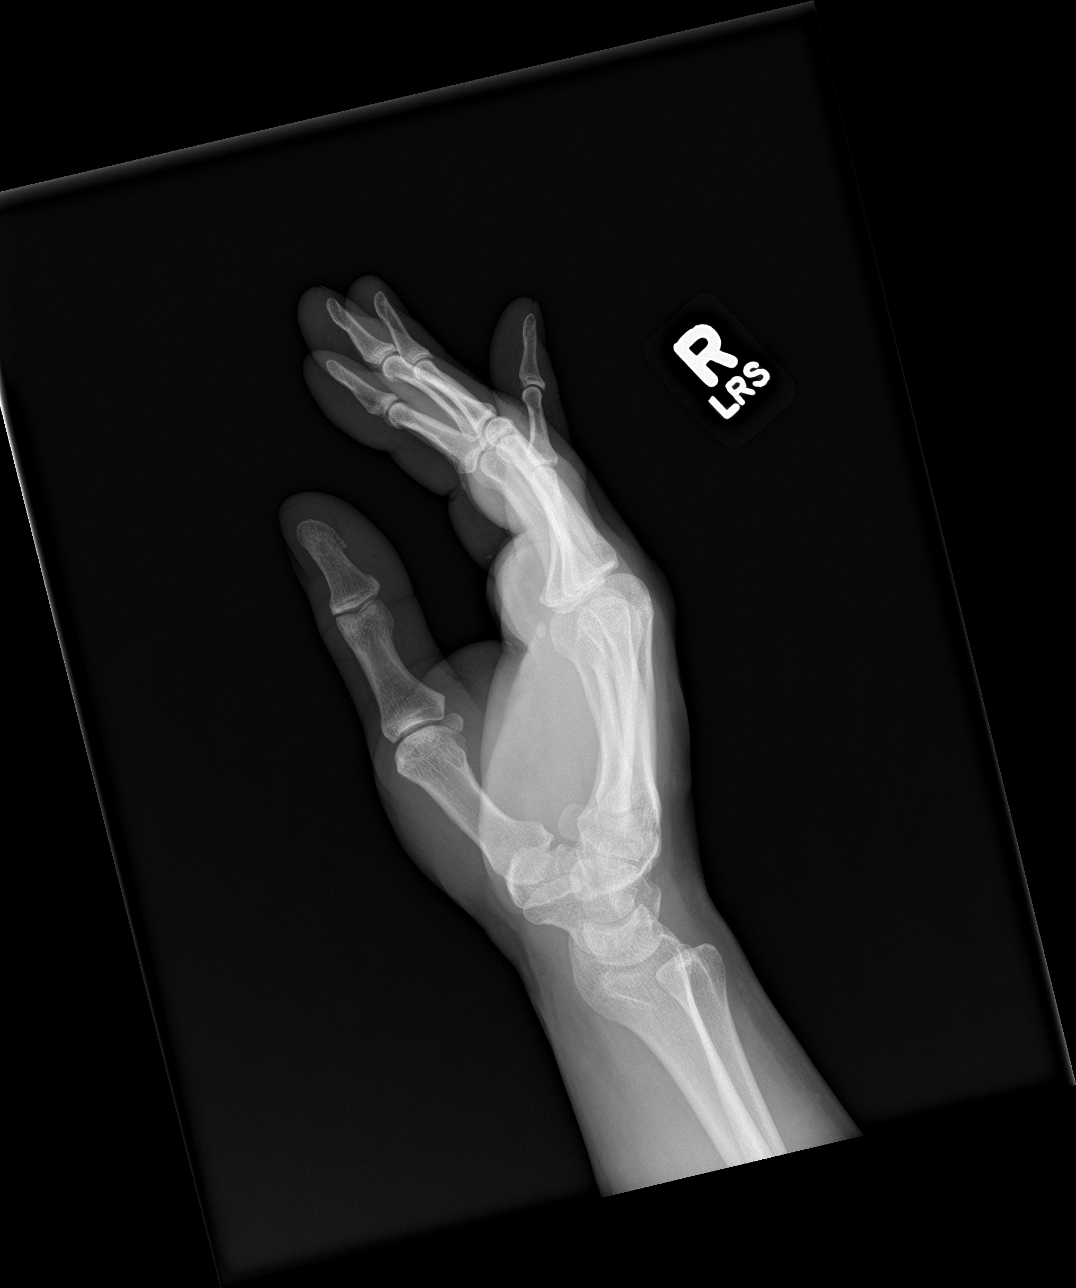

[3 of 3 positions shown; findings below may reference images not displayed]

FINDINGS: Soft tissue irregularity with swelling seen about the PIP joints of
the second through fifth digits, suspicious for soft tissue injury/
laceration. There is a small 3 mm radiopaque foreign body at the
radial aspect of the right third digit, just proximal to the PIP
joint. Additional punctate radiopaque density at the skin of the
radial aspect of the right second digit near the base. No other
radiopaque foreign body. No acute fracture dislocation. Scattered
osteoarthritic changes noted about the hand.
IMPRESSION: 1. Soft tissue swelling with irregularity about the PIP joints of
the right second through fifth digits, compatible with soft tissue
injury/laceration.
2. 2 subcentimeter radiopaque densities at the radial aspects of the
proximal right second and third digits as above, consistent with
small foreign bodies.
3. No acute fracture or dislocation.

## 2017-08-08 DIAGNOSIS — Z0000 Encounter for general adult medical examination without abnormal findings: Secondary | ICD-10-CM | POA: Diagnosis not present

## 2017-08-15 DIAGNOSIS — R030 Elevated blood-pressure reading, without diagnosis of hypertension: Secondary | ICD-10-CM | POA: Diagnosis not present

## 2017-08-15 DIAGNOSIS — M79641 Pain in right hand: Secondary | ICD-10-CM | POA: Diagnosis not present

## 2017-08-15 DIAGNOSIS — Z1389 Encounter for screening for other disorder: Secondary | ICD-10-CM | POA: Diagnosis not present

## 2017-08-15 DIAGNOSIS — Z87898 Personal history of other specified conditions: Secondary | ICD-10-CM | POA: Diagnosis not present

## 2017-08-15 DIAGNOSIS — R945 Abnormal results of liver function studies: Secondary | ICD-10-CM | POA: Diagnosis not present

## 2017-08-15 DIAGNOSIS — Z0000 Encounter for general adult medical examination without abnormal findings: Secondary | ICD-10-CM | POA: Diagnosis not present

## 2018-01-02 DIAGNOSIS — M25511 Pain in right shoulder: Secondary | ICD-10-CM | POA: Diagnosis not present

## 2018-01-02 DIAGNOSIS — M542 Cervicalgia: Secondary | ICD-10-CM | POA: Diagnosis not present

## 2018-01-29 DIAGNOSIS — S83281A Other tear of lateral meniscus, current injury, right knee, initial encounter: Secondary | ICD-10-CM | POA: Diagnosis not present

## 2018-07-17 DIAGNOSIS — H52223 Regular astigmatism, bilateral: Secondary | ICD-10-CM | POA: Diagnosis not present

## 2018-08-21 DIAGNOSIS — Z0000 Encounter for general adult medical examination without abnormal findings: Secondary | ICD-10-CM | POA: Diagnosis not present

## 2018-08-21 DIAGNOSIS — R82998 Other abnormal findings in urine: Secondary | ICD-10-CM | POA: Diagnosis not present

## 2018-08-28 DIAGNOSIS — Z87898 Personal history of other specified conditions: Secondary | ICD-10-CM | POA: Diagnosis not present

## 2018-08-28 DIAGNOSIS — R030 Elevated blood-pressure reading, without diagnosis of hypertension: Secondary | ICD-10-CM | POA: Diagnosis not present

## 2018-08-28 DIAGNOSIS — Z0000 Encounter for general adult medical examination without abnormal findings: Secondary | ICD-10-CM | POA: Diagnosis not present

## 2018-08-28 DIAGNOSIS — R945 Abnormal results of liver function studies: Secondary | ICD-10-CM | POA: Diagnosis not present

## 2018-08-28 DIAGNOSIS — M79641 Pain in right hand: Secondary | ICD-10-CM | POA: Diagnosis not present

## 2019-01-05 DIAGNOSIS — S83281D Other tear of lateral meniscus, current injury, right knee, subsequent encounter: Secondary | ICD-10-CM | POA: Diagnosis not present

## 2019-01-12 DIAGNOSIS — M25561 Pain in right knee: Secondary | ICD-10-CM | POA: Diagnosis not present

## 2019-01-14 DIAGNOSIS — M25561 Pain in right knee: Secondary | ICD-10-CM | POA: Diagnosis not present

## 2019-04-02 DIAGNOSIS — Z20828 Contact with and (suspected) exposure to other viral communicable diseases: Secondary | ICD-10-CM | POA: Diagnosis not present

## 2019-04-02 DIAGNOSIS — Z6833 Body mass index (BMI) 33.0-33.9, adult: Secondary | ICD-10-CM | POA: Diagnosis not present

## 2019-06-08 ENCOUNTER — Other Ambulatory Visit: Payer: Self-pay

## 2019-06-08 DIAGNOSIS — Z20822 Contact with and (suspected) exposure to covid-19: Secondary | ICD-10-CM

## 2019-06-09 DIAGNOSIS — Z20828 Contact with and (suspected) exposure to other viral communicable diseases: Secondary | ICD-10-CM | POA: Diagnosis not present

## 2019-06-09 DIAGNOSIS — Z6833 Body mass index (BMI) 33.0-33.9, adult: Secondary | ICD-10-CM | POA: Diagnosis not present

## 2019-06-09 DIAGNOSIS — Z119 Encounter for screening for infectious and parasitic diseases, unspecified: Secondary | ICD-10-CM | POA: Diagnosis not present

## 2019-06-09 LAB — NOVEL CORONAVIRUS, NAA: SARS-CoV-2, NAA: NOT DETECTED

## 2020-03-06 ENCOUNTER — Ambulatory Visit
Admission: EM | Admit: 2020-03-06 | Discharge: 2020-03-06 | Disposition: A | Discharge status: Home / Self Care | Payer: BLUE CROSS/BLUE SHIELD | Attending: Physician Assistant | Admitting: Physician Assistant

## 2020-03-06 DIAGNOSIS — M778 Other enthesopathies, not elsewhere classified: Secondary | ICD-10-CM

## 2020-03-06 MED ORDER — MELOXICAM 7.5 MG PO TABS: 7.5000 mg | ORAL_TABLET | Freq: Every day | ORAL | 0 refills | Status: DC

## 2020-03-06 NOTE — Discharge Instructions (Signed)
Start Mobic. Do not take ibuprofen (motrin/advil)/ naproxen (aleve) while on mobic. Ice compress, wrist splint during activity. Follow up with sports medicine/orthopedics if symptoms not improving.

## 2020-03-06 NOTE — ED Provider Notes (Signed)
[facility]    CSN: [id number] Arrival date & time: 03/06/20  1555      History   Chief Complaint Chief Complaint  Patient presents with  . Wrist Pain    HPI Rickey Lee is a 39 y.o. male.   39 year old male comes in for 4 day history of left wrist pain.  Denies injury/trauma.  Work requires heavy lifting, repetitive motion.  States pain to the ulnar side of the wrist, worse with range of motion.  Now having some decreased strength with lifting.  Occasional numbness, tingling to the fingers.  Wrist brace this weekend without relief. RHD      History reviewed. No pertinent past medical history.  There are no problems to display for this patient.   Past Surgical History:  Procedure Laterality Date  . SHOULDER SURGERY         Home Medications    Prior to Admission medications   Medication Sig Start Date End Date Taking? Authorizing Provider  meloxicam (MOBIC) 7.5 MG tablet Take 1 tablet (7.5 mg total) by mouth daily. 03/06/20   Belinda Fisher, PA-C    Family History History reviewed. No pertinent family history.  Social History Social History   Tobacco Use  . Smoking status: Current Every Day Smoker    Types: Cigarettes  . Smokeless tobacco: Current User  . Tobacco comment: Vape  Substance Use Topics  . Alcohol use: No    Alcohol/week: 0.0 standard drinks  . Drug use: No     Allergies   Patient has no known allergies.   Review of Systems Review of Systems  Reason unable to perform ROS: See HPI as above.     Physical Exam Triage Vital Signs ED Triage Vitals  Enc Vitals Group     BP 03/06/20 1614 (!) 148/98     Pulse Rate 03/06/20 1614 (!) 56     Resp 03/06/20 1614 16     Temp 03/06/20 1614 98 F (36.7 C)     Temp Source 03/06/20 1614 Oral     SpO2 03/06/20 1614 95 %     Weight --      Height --      Head Circumference --      Peak Flow --      Pain Score 03/06/20 1620 6     Pain Loc --      Pain Edu? --      Excl. in GC? --     No data found.  Updated Vital Signs BP (!) 148/98 (BP Location: Left Arm)   Pulse (!) 56   Temp 98 F (36.7 C) (Oral)   Resp 16   SpO2 95%   Physical Exam Constitutional:      General: He is not in acute distress.    Appearance: Normal appearance. He is well-developed. He is not toxic-appearing or diaphoretic.  HENT:     Head: Normocephalic and atraumatic.  Eyes:     Conjunctiva/sclera: Conjunctivae normal.     Pupils: Pupils are equal, round, and reactive to light.  Pulmonary:     Effort: Pulmonary effort is normal. No respiratory distress.  Musculoskeletal:     Cervical back: Normal range of motion and neck supple.     Comments: No swelling, erythema, warmth, contusion.  Tenderness to palpation along ulna left wrist.  No tenderness to palpation of the hand.  Full range of motion of wrist, fingers. Strength 5/5. Though with pain to ulna wrist.  Sensation intact. Radial pulse 2+  Skin:    General: Skin is warm and dry.  Neurological:     Mental Status: He is alert and oriented to person, place, and time.      UC Treatments / Results  Labs (all labs ordered are listed, but only abnormal results are displayed) Labs Reviewed - No data to display  EKG   Radiology No results found.  Procedures Procedures (including critical care time)  Medications Ordered in UC Medications - No data to display  Initial Impression / Assessment and Plan / UC Course  I have reviewed the triage vital signs and the nursing notes.  Pertinent labs & imaging results that were available during my care of the patient were reviewed by me and considered in my medical decision making (see chart for details).    History and exam consistent with tendinitis.  NSAIDs, ice compress, rest, wrist splint during activity.  To follow-up with sports medicine/orthopedics if symptoms not improving.  Return precautions given.  Final Clinical Impressions(s) / UC Diagnoses   Final diagnoses:  Left wrist  tendinitis   ED Prescriptions    Medication Sig Dispense Auth. Provider   meloxicam (MOBIC) 7.5 MG tablet Take 1 tablet (7.5 mg total) by mouth daily. 10 tablet Belinda Fisher, PA-C     PDMP not reviewed this encounter.   Belinda Fisher, PA-C 03/06/20 1645

## 2020-03-06 NOTE — ED Triage Notes (Signed)
Pt c/o lt wrist pain after getting off work Friday morning. No known injury. States pain on turning, picking up things, and gripping. States wore a wrist brace all weekend with no relief.

## 2020-04-06 ENCOUNTER — Ambulatory Visit (INDEPENDENT_AMBULATORY_CARE_PROVIDER_SITE_OTHER): Payer: BLUE CROSS/BLUE SHIELD

## 2020-04-06 ENCOUNTER — Other Ambulatory Visit: Payer: Self-pay

## 2020-04-06 ENCOUNTER — Ambulatory Visit
Admission: EM | Admit: 2020-04-06 | Discharge: 2020-04-06 | Disposition: A | Discharge status: Home / Self Care | Payer: BLUE CROSS/BLUE SHIELD | Attending: Emergency Medicine | Admitting: Emergency Medicine

## 2020-04-06 DIAGNOSIS — S8991XA Unspecified injury of right lower leg, initial encounter: Secondary | ICD-10-CM | POA: Diagnosis not present

## 2020-04-06 DIAGNOSIS — M25561 Pain in right knee: Secondary | ICD-10-CM

## 2020-04-06 DIAGNOSIS — T1490XA Injury, unspecified, initial encounter: Secondary | ICD-10-CM | POA: Diagnosis not present

## 2020-04-06 MED ORDER — NAPROXEN 500 MG PO TABS: 500.0000 mg | ORAL_TABLET | Freq: Two times a day (BID) | ORAL | 0 refills | Status: DC
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
## 2020-04-06 NOTE — Discharge Instructions (Addendum)
RICE: rest, ice, compression, elevation as needed for pain.   Cold therapy (ice packs) can be used to help swelling both after injury and after prolonged use of areas of chronic pain/aches.  Pain medication:  500 mg Naprosyn/Aleve (naproxen) every 12 hours with food:  AVOID other NSAIDs while taking this (may have Tylenol).  Important to follow up with specialist(s) below for further evaluation/management if your symptoms persist or worsen. 

## 2021-03-10 DIAGNOSIS — Y92002 Bathroom of unspecified non-institutional (private) residence single-family (private) house as the place of occurrence of the external cause: Secondary | ICD-10-CM | POA: Diagnosis not present

## 2021-03-10 DIAGNOSIS — G8911 Acute pain due to trauma: Secondary | ICD-10-CM | POA: Diagnosis not present

## 2021-03-10 DIAGNOSIS — F17290 Nicotine dependence, other tobacco product, uncomplicated: Secondary | ICD-10-CM | POA: Diagnosis not present

## 2021-03-10 DIAGNOSIS — S8011XA Contusion of right lower leg, initial encounter: Secondary | ICD-10-CM | POA: Diagnosis not present

## 2021-03-10 DIAGNOSIS — M79604 Pain in right leg: Secondary | ICD-10-CM | POA: Diagnosis not present

## 2021-03-10 DIAGNOSIS — W01198A Fall on same level from slipping, tripping and stumbling with subsequent striking against other object, initial encounter: Secondary | ICD-10-CM | POA: Diagnosis not present

## 2021-05-26 DIAGNOSIS — H52223 Regular astigmatism, bilateral: Secondary | ICD-10-CM | POA: Diagnosis not present

## 2021-07-28 DIAGNOSIS — H11442 Conjunctival cysts, left eye: Secondary | ICD-10-CM | POA: Diagnosis not present

## 2021-09-07 DIAGNOSIS — M25561 Pain in right knee: Secondary | ICD-10-CM | POA: Diagnosis not present

## 2021-09-17 DIAGNOSIS — M25561 Pain in right knee: Secondary | ICD-10-CM | POA: Diagnosis not present

## 2021-09-21 DIAGNOSIS — M25561 Pain in right knee: Secondary | ICD-10-CM | POA: Diagnosis not present

## 2021-10-11 DIAGNOSIS — M25561 Pain in right knee: Secondary | ICD-10-CM | POA: Diagnosis not present

## 2021-10-16 DIAGNOSIS — M25561 Pain in right knee: Secondary | ICD-10-CM | POA: Diagnosis not present

## 2021-10-18 DIAGNOSIS — M25561 Pain in right knee: Secondary | ICD-10-CM | POA: Diagnosis not present

## 2021-10-23 DIAGNOSIS — M25561 Pain in right knee: Secondary | ICD-10-CM | POA: Diagnosis not present

## 2021-10-25 DIAGNOSIS — M25561 Pain in right knee: Secondary | ICD-10-CM | POA: Diagnosis not present

## 2021-10-30 DIAGNOSIS — M25561 Pain in right knee: Secondary | ICD-10-CM | POA: Diagnosis not present

## 2021-11-01 DIAGNOSIS — M25561 Pain in right knee: Secondary | ICD-10-CM | POA: Diagnosis not present

## 2021-11-06 DIAGNOSIS — M25561 Pain in right knee: Secondary | ICD-10-CM | POA: Diagnosis not present

## 2021-11-08 DIAGNOSIS — M25561 Pain in right knee: Secondary | ICD-10-CM | POA: Diagnosis not present

## 2022-07-12 ENCOUNTER — Encounter (HOSPITAL_BASED_OUTPATIENT_CLINIC_OR_DEPARTMENT_OTHER): Payer: Self-pay | Admitting: Emergency Medicine

## 2022-07-12 ENCOUNTER — Emergency Department (HOSPITAL_BASED_OUTPATIENT_CLINIC_OR_DEPARTMENT_OTHER): Payer: BC Managed Care – PPO | Admitting: Radiology

## 2022-07-12 ENCOUNTER — Emergency Department (HOSPITAL_BASED_OUTPATIENT_CLINIC_OR_DEPARTMENT_OTHER)
Admission: EM | Admit: 2022-07-12 | Discharge: 2022-07-12 | Disposition: A | Discharge status: Home / Self Care | Payer: BC Managed Care – PPO | Attending: Emergency Medicine | Admitting: Emergency Medicine

## 2022-07-12 DIAGNOSIS — T148XXA Other injury of unspecified body region, initial encounter: Secondary | ICD-10-CM | POA: Insufficient documentation

## 2022-07-12 DIAGNOSIS — F17210 Nicotine dependence, cigarettes, uncomplicated: Secondary | ICD-10-CM | POA: Insufficient documentation

## 2022-07-12 DIAGNOSIS — S8011XA Contusion of right lower leg, initial encounter: Secondary | ICD-10-CM | POA: Diagnosis not present

## 2022-07-12 DIAGNOSIS — S8991XA Unspecified injury of right lower leg, initial encounter: Secondary | ICD-10-CM | POA: Diagnosis not present

## 2022-07-12 DIAGNOSIS — Y990 Civilian activity done for income or pay: Secondary | ICD-10-CM | POA: Diagnosis not present

## 2022-07-12 DIAGNOSIS — W208XXA Other cause of strike by thrown, projected or falling object, initial encounter: Secondary | ICD-10-CM | POA: Diagnosis not present

## 2022-07-12 DIAGNOSIS — M79661 Pain in right lower leg: Secondary | ICD-10-CM | POA: Diagnosis not present

## 2022-07-12 DIAGNOSIS — S8001XA Contusion of right knee, initial encounter: Secondary | ICD-10-CM | POA: Diagnosis not present

## 2022-07-12 MED ORDER — HYDROCODONE-ACETAMINOPHEN 5-325 MG PO TABS: 1.0000 | ORAL_TABLET | Freq: Four times a day (QID) | ORAL | 0 refills | Status: DC | PRN

## 2022-07-12 MED ORDER — HYDROCODONE-ACETAMINOPHEN 5-325 MG PO TABS
1.0000 | ORAL_TABLET | Freq: Once | ORAL | Status: AC
  Administered 2022-07-12: 1 via ORAL
  Filled 2022-07-12: qty 1

## 2022-07-12 NOTE — ED Provider Notes (Signed)
[facility] Provider Note: Lowella Dell, MD, FACEP  CSN: [id number] MRN: [id number] ARRIVAL: 07/12/22 at 0543 ROOM: DB016/DB016   CHIEF COMPLAINT  Leg Injury   HISTORY OF PRESENT ILLNESS  07/12/22 6:21 AM Rickey Lee is a 41 y.o. male who dropped a weight on his right knee and lower leg work this morning about 1:30 AM.  He has some pain in his right knee but has had the gradual onset of pain in his mid shin which is now fairly severe (8 out of 10).  It is difficult to bear weight on his right leg due to the pain.  It is also painful to palpate the right shin.    History reviewed. No pertinent past medical history.  Past Surgical History:  Procedure Laterality Date   SHOULDER SURGERY      History reviewed. No pertinent family history.  Social History   Tobacco Use   Smoking status: Every Day    Types: Cigarettes   Smokeless tobacco: Current   Tobacco comments:    Vape  Substance Use Topics   Alcohol use: No    Alcohol/week: 0.0 standard drinks of alcohol   Drug use: No    Prior to Admission medications   Not on File    Allergies Patient has no known allergies.   REVIEW OF SYSTEMS  Negative except as noted here or in the History of Present Illness.   PHYSICAL EXAMINATION  Initial Vital Signs Blood pressure (!) 173/106, pulse 78, temperature 97.8 F (36.6 C), resp. rate 18, height 5\' 8"  (1.727 m), weight 95 kg, SpO2 99 %.  Examination General: Well-developed, well-nourished male in no acute distress; appearance consistent with age of record HENT: normocephalic; atraumatic Eyes: Normal appearance Neck: supple Heart: regular rate and rhythm Lungs: clear to auscultation bilaterally Abdomen: soft; nondistended; nontender; bowel sounds present Extremities: No deformity; mild tenderness of right knee without swelling, ecchymosis or effusion; tenderness of right shin, compartments soft, distal pulses +2 Neurologic: Awake, alert and oriented; motor  function intact in all extremities and symmetric; no facial droop Skin: Warm and dry Psychiatric: Normal mood and affect   RESULTS  Summary of this visit's results, reviewed and interpreted by myself:   EKG Interpretation  Date/Time:    Ventricular Rate:    PR Interval:    QRS Duration:   QT Interval:    QTC Calculation:   R Axis:     Text Interpretation:         Laboratory Studies: No results found for this or any previous visit (from the past 24 hour(s)). Imaging Studies: DG Tibia/Fibula Right  Result Date: 07/12/2022 CLINICAL DATA:  Dropped a weight on to the right leg.  Pain. EXAM: RIGHT TIBIA AND FIBULA - 2 VIEW COMPARISON:  None Available. FINDINGS: There is no evidence of fracture or other focal bone lesions. Soft tissues are unremarkable. IMPRESSION: Negative. Electronically Signed   By: 14/03/2022 M.D.   On: 07/12/2022 07:25    ED COURSE and MDM  Nursing notes, initial and subsequent vitals signs, including pulse oximetry, reviewed and interpreted by myself.  Vitals:   07/12/22 0608 07/12/22 0609  BP:  (!) 173/106  Pulse:  78  Resp:  18  Temp:  97.8 F (36.6 C)  SpO2:  99%  Weight: 95 kg   Height: 5\' 8"  (1.727 m)    Medications  HYDROcodone-acetaminophen (NORCO/VICODIN) 5-325 MG per tablet 1 tablet (1 tablet Oral Given 07/12/22 0642)   7:20 AM No  acute fracture appreciated on radiographs.  I suspect he may have a subperiosteal hematoma.  Slowly stretching subperiosteal hematoma could cause the gradual onset of fairly severe bony pain sometime after the original injury.   PROCEDURES  Procedures   ED DIAGNOSES     ICD-10-CM   1. Contusion of bone  T14.8XXA          Rigoberto Repass, Jonny Ruiz, MD 07/12/22 [phone]

## 2022-07-12 NOTE — ED Triage Notes (Signed)
Pt c/o right lower leg pain after dropping a weight on his leg. Pt states he does not know the amount of the weight.

## 2022-07-12 NOTE — ED Notes (Signed)
Pt awake and alert lying in bed - calm and relaxed.  Reports weight hitting anterior R knee and anterior R lower leg -- mild edema to medial anterior R lower leg with scant abrasion to distal R knee.  RLE distal neurovascular status intact.  Pain now rated 7-8/10 - Vicodin administered (see MAR) - awaits imaging on R tib/fib.  Pt agreeable with plan

## 2022-09-20 DIAGNOSIS — E786 Lipoprotein deficiency: Secondary | ICD-10-CM | POA: Diagnosis not present

## 2022-09-20 DIAGNOSIS — Z125 Encounter for screening for malignant neoplasm of prostate: Secondary | ICD-10-CM | POA: Diagnosis not present

## 2022-09-20 DIAGNOSIS — R030 Elevated blood-pressure reading, without diagnosis of hypertension: Secondary | ICD-10-CM | POA: Diagnosis not present

## 2022-09-27 ENCOUNTER — Other Ambulatory Visit: Payer: Self-pay | Admitting: Internal Medicine

## 2022-09-27 DIAGNOSIS — R945 Abnormal results of liver function studies: Secondary | ICD-10-CM

## 2022-09-27 DIAGNOSIS — R82998 Other abnormal findings in urine: Secondary | ICD-10-CM | POA: Diagnosis not present

## 2022-09-27 DIAGNOSIS — Z0000 Encounter for general adult medical examination without abnormal findings: Secondary | ICD-10-CM | POA: Diagnosis not present

## 2022-12-08 DIAGNOSIS — M25561 Pain in right knee: Secondary | ICD-10-CM | POA: Diagnosis not present

## 2023-05-09 DIAGNOSIS — H524 Presbyopia: Secondary | ICD-10-CM | POA: Diagnosis not present

## 2023-09-26 DIAGNOSIS — E786 Lipoprotein deficiency: Secondary | ICD-10-CM | POA: Diagnosis not present

## 2023-09-26 DIAGNOSIS — Z1389 Encounter for screening for other disorder: Secondary | ICD-10-CM | POA: Diagnosis not present

## 2023-09-29 DIAGNOSIS — Z125 Encounter for screening for malignant neoplasm of prostate: Secondary | ICD-10-CM | POA: Diagnosis not present

## 2023-09-29 DIAGNOSIS — Z1389 Encounter for screening for other disorder: Secondary | ICD-10-CM | POA: Diagnosis not present

## 2023-09-29 DIAGNOSIS — Z1212 Encounter for screening for malignant neoplasm of rectum: Secondary | ICD-10-CM | POA: Diagnosis not present

## 2023-09-29 DIAGNOSIS — E786 Lipoprotein deficiency: Secondary | ICD-10-CM | POA: Diagnosis not present

## 2023-10-03 DIAGNOSIS — E786 Lipoprotein deficiency: Secondary | ICD-10-CM | POA: Diagnosis not present

## 2023-10-03 DIAGNOSIS — Z0000 Encounter for general adult medical examination without abnormal findings: Secondary | ICD-10-CM | POA: Diagnosis not present

## 2023-10-03 DIAGNOSIS — Z1331 Encounter for screening for depression: Secondary | ICD-10-CM | POA: Diagnosis not present

## 2023-10-03 DIAGNOSIS — R82998 Other abnormal findings in urine: Secondary | ICD-10-CM | POA: Diagnosis not present

## 2023-12-26 DIAGNOSIS — H00014 Hordeolum externum left upper eyelid: Secondary | ICD-10-CM | POA: Diagnosis not present

## 2023-12-28 DIAGNOSIS — H9203 Otalgia, bilateral: Secondary | ICD-10-CM | POA: Diagnosis not present

## 2023-12-28 DIAGNOSIS — J029 Acute pharyngitis, unspecified: Secondary | ICD-10-CM | POA: Diagnosis not present

## 2024-01-15 DIAGNOSIS — L720 Epidermal cyst: Secondary | ICD-10-CM | POA: Diagnosis not present

## 2024-02-15 ENCOUNTER — Emergency Department (HOSPITAL_BASED_OUTPATIENT_CLINIC_OR_DEPARTMENT_OTHER)

## 2024-02-15 ENCOUNTER — Emergency Department (HOSPITAL_BASED_OUTPATIENT_CLINIC_OR_DEPARTMENT_OTHER)
Admission: EM | Admit: 2024-02-15 | Discharge: 2024-02-15 | Disposition: A | Discharge status: Home / Self Care | Attending: Emergency Medicine | Admitting: Emergency Medicine

## 2024-02-15 ENCOUNTER — Telehealth (HOSPITAL_BASED_OUTPATIENT_CLINIC_OR_DEPARTMENT_OTHER): Payer: Self-pay | Admitting: Emergency Medicine

## 2024-02-15 ENCOUNTER — Other Ambulatory Visit: Payer: Self-pay

## 2024-02-15 ENCOUNTER — Encounter (HOSPITAL_BASED_OUTPATIENT_CLINIC_OR_DEPARTMENT_OTHER): Payer: Self-pay

## 2024-02-15 DIAGNOSIS — W541XXA Struck by dog, initial encounter: Secondary | ICD-10-CM | POA: Diagnosis not present

## 2024-02-15 DIAGNOSIS — S8391XA Sprain of unspecified site of right knee, initial encounter: Secondary | ICD-10-CM | POA: Insufficient documentation

## 2024-02-15 DIAGNOSIS — S8991XA Unspecified injury of right lower leg, initial encounter: Secondary | ICD-10-CM | POA: Diagnosis not present

## 2024-02-15 DIAGNOSIS — M25561 Pain in right knee: Secondary | ICD-10-CM | POA: Diagnosis not present

## 2024-02-15 DIAGNOSIS — S838X1A Sprain of other specified parts of right knee, initial encounter: Secondary | ICD-10-CM | POA: Diagnosis not present

## 2024-02-15 DIAGNOSIS — M7989 Other specified soft tissue disorders: Secondary | ICD-10-CM | POA: Insufficient documentation

## 2024-02-15 MED ORDER — OXYCODONE-ACETAMINOPHEN 5-325 MG PO TABS: 1.0000 | ORAL_TABLET | ORAL | 0 refills | Status: DC | PRN

## 2024-02-15 MED ORDER — OXYCODONE-ACETAMINOPHEN 5-325 MG PO TABS
1.0000 | ORAL_TABLET | Freq: Once | ORAL | Status: AC
  Administered 2024-02-15: 1 via ORAL
  Filled 2024-02-15: qty 1

## 2024-02-15 MED ORDER — IBUPROFEN 800 MG PO TABS
800.0000 mg | ORAL_TABLET | Freq: Once | ORAL | Status: AC
  Administered 2024-02-15: 800 mg via ORAL
  Filled 2024-02-15: qty 1

## 2024-02-15 NOTE — ED Triage Notes (Addendum)
 Pt reports he is here today due to right knee pain. Pt reports that yesterday he was doing an activity when his dog got in the way.Pt reports he tripped over the dog and fell onto his right knee. Pt reports that now he unable to bear weight on the right leg.

## 2024-02-15 NOTE — ED Provider Notes (Signed)
 [place] [facility] AT [facility] Provider Note   CSN: [id number] Arrival date & time: 02/15/24  9385     Patient presents with: Knee Pain   Rickey Lee is a 43 y.o. male.   Presents to the emergency for evaluation of right knee injury.  Patient reports that he tripped over his dog and fell.  There was a twisting motion and then he landed on the right knee.  He is having pain on the outside portion of the knee and has difficulty bearing weight.  No other injury.       Prior to Admission medications   Medication Sig Start Date End Date Taking? Authorizing Provider  oxyCODONE -acetaminophen  (PERCOCET) 5-325 MG tablet Take 1-2 tablets by mouth every 4 (four) hours as needed. 02/15/24  Yes Delrico Minehart, Lonni PARAS, MD  HYDROcodone -acetaminophen  (NORCO) 5-325 MG tablet Take 1 tablet by mouth every 6 (six) hours as needed. 07/12/22   Molpus, Norleen, MD    Allergies: Patient has no known allergies.    Review of Systems  Updated Vital Signs BP (!) 174/114   Pulse 77   Temp 97.9 F (36.6 C)   Resp 18   SpO2 98%   Physical Exam Vitals and nursing note reviewed.  Constitutional:      Appearance: Normal appearance.  HENT:     Head: Normocephalic and atraumatic.  Musculoskeletal:     Right knee: No swelling, deformity, effusion, erythema or ecchymosis. Decreased range of motion. Tenderness present over the lateral joint line and LCL. No patellar tendon tenderness. No LCL laxity, MCL laxity, ACL laxity or PCL laxity. Normal alignment and normal patellar mobility.     Instability Tests: Anterior drawer test negative. Posterior drawer test negative.  Skin:    General: Skin is warm and dry.     Findings: No wound.  Neurological:     Mental Status: He is alert.     Cranial Nerves: Cranial nerves 2-12 are intact.     Sensory: Sensation is intact.     Motor: Motor function is intact.     (all labs ordered are listed, but only abnormal results are  displayed) Labs Reviewed - No data to display  EKG: None  Radiology: DG Knee Complete 4 Views Right Result Date: 02/15/2024 CLINICAL DATA:  Right knee pain. EXAM: RIGHT KNEE - COMPLETE 4+ VIEW COMPARISON:  None Available. FINDINGS: No evidence of fracture, dislocation, or joint effusion. No evidence of arthropathy or other focal bone abnormality. Soft tissues are unremarkable. IMPRESSION: Negative. Electronically Signed   By: Camellia Candle M.D.   On: 02/15/2024 07:08     Procedures   Medications Ordered in the ED  oxyCODONE -acetaminophen  (PERCOCET/ROXICET) 5-325 MG per tablet 1 tablet (has no administration in time range)  ibuprofen  (ADVIL ) tablet 800 mg (has no administration in time range)                                    Medical Decision Making Amount and/or Complexity of Data Reviewed Radiology: ordered.   Presents to the emergency department for evaluation of knee injury.  Patient having pain on the lateral aspect of the right knee.  No deformity.  No ligamentous instability on exam.  No joint effusion.  X-ray without fracture.  Will immobilize, provide analgesia, orthopedic follow-up to rule out ligamentous injury.     Final diagnoses:  Sprain of right knee, unspecified ligament, initial encounter  ED Discharge Orders          Ordered    oxyCODONE -acetaminophen  (PERCOCET) 5-325 MG tablet  Every 4 hours PRN        02/15/24 9287               Haze Lonni PARAS, MD 02/15/24 [phone]

## 2024-02-25 DIAGNOSIS — S83241A Other tear of medial meniscus, current injury, right knee, initial encounter: Secondary | ICD-10-CM | POA: Diagnosis not present

## 2024-02-27 ENCOUNTER — Other Ambulatory Visit: Payer: Self-pay | Admitting: Orthopedic Surgery

## 2024-02-27 DIAGNOSIS — M25561 Pain in right knee: Secondary | ICD-10-CM

## 2024-03-03 ENCOUNTER — Ambulatory Visit
Admission: RE | Admit: 2024-03-03 | Discharge: 2024-03-03 | Disposition: A | Discharge status: Home / Self Care | Source: Ambulatory Visit | Attending: Orthopedic Surgery | Admitting: Orthopedic Surgery

## 2024-03-03 DIAGNOSIS — R600 Localized edema: Secondary | ICD-10-CM | POA: Diagnosis not present

## 2024-03-03 DIAGNOSIS — M25561 Pain in right knee: Secondary | ICD-10-CM

## 2024-04-06 ENCOUNTER — Ambulatory Visit: Attending: Orthopedic Surgery

## 2024-04-06 DIAGNOSIS — S8391XD Sprain of unspecified site of right knee, subsequent encounter: Secondary | ICD-10-CM | POA: Insufficient documentation

## 2024-04-06 DIAGNOSIS — M6281 Muscle weakness (generalized): Secondary | ICD-10-CM | POA: Insufficient documentation

## 2024-04-06 DIAGNOSIS — M25561 Pain in right knee: Secondary | ICD-10-CM | POA: Insufficient documentation

## 2024-04-06 NOTE — Therapy (Incomplete)
 OUTPATIENT PHYSICAL THERAPY LOWER EXTREMITY EVALUATION   Patient Name: Rickey Lee MRN: [id number] DOB:1980/11/18, 43 y.o., male Today's Date: 04/06/2024  END OF SESSION:   No past medical history on file. Past Surgical History:  Procedure Laterality Date   SHOULDER SURGERY     There are no active problems to display for this patient.   PCP: Norleen Jungling  REFERRING PROVIDER: Ozell Bruch  REFERRING DIAG: Rt knee pain   THERAPY DIAG:  No diagnosis found.  Rationale for Evaluation and Treatment: Rehabilitation  ONSET DATE: 02/15/24  SUBJECTIVE:   SUBJECTIVE STATEMENT: ***  PERTINENT HISTORY: Presents to the emergency for evaluation of right knee injury.  Patient reports that he tripped over his dog and fell.  There was a twisting motion and then he landed on the right knee.  He is having pain on the outside portion of the knee and has difficulty bearing weight.  No other injury.   PAIN:  Are you having pain? {OPRCPAIN:27236}  PRECAUTIONS: {Therapy precautions:24002}  RED FLAGS: {PT Red Flags:29287}   WEIGHT BEARING RESTRICTIONS: {Yes ***/No:24003}  FALLS:  Has patient fallen in last 6 months? {fallsyesno:27318}  LIVING ENVIRONMENT: Lives with: {OPRC lives with:25569::lives with their family} Lives in: {Lives in:25570} Stairs: {opstairs:27293} Has following equipment at home: {Assistive devices:23999}  OCCUPATION: ***  PLOF: {PLOF:24004}  PATIENT GOALS: ***  NEXT MD VISIT: ***  OBJECTIVE:  Note: Objective measures were completed at Evaluation unless otherwise noted.  DIAGNOSTIC FINDINGS:  Mild prepatellar edema. Otherwise, unremarkable MRI of the right knee.  X-ray- No evidence of fracture, dislocation, or joint effusion. No evidence of arthropathy or other focal bone abnormality. Soft tissues are unremarkable.  PATIENT SURVEYS:  {rehab surveys:24030}  COGNITION: Overall cognitive status:  {cognition:24006}     SENSATION: {sensation:27233}  EDEMA:  {edema:24020}  MUSCLE LENGTH: Hamstrings: Right *** deg; Left *** deg Debby test: Right *** deg; Left *** deg  POSTURE: {posture:25561}  PALPATION: ***  LOWER EXTREMITY ROM:  {AROM/PROM:27142} ROM Right eval Left eval  Hip flexion    Hip extension    Hip abduction    Hip adduction    Hip internal rotation    Hip external rotation    Knee flexion    Knee extension    Ankle dorsiflexion    Ankle plantarflexion    Ankle inversion    Ankle eversion     (Blank rows = not tested)  LOWER EXTREMITY MMT:  MMT Right eval Left eval  Hip flexion    Hip extension    Hip abduction    Hip adduction    Hip internal rotation    Hip external rotation    Knee flexion    Knee extension    Ankle dorsiflexion    Ankle plantarflexion    Ankle inversion    Ankle eversion     (Blank rows = not tested)  LOWER EXTREMITY SPECIAL TESTS:  {LEspecialtests:26242}  FUNCTIONAL TESTS:  {Functional tests:24029}  GAIT: Distance walked: *** Assistive device utilized: {Assistive devices:23999} Level of assistance: {Levels of assistance:24026} Comments: ***  TREATMENT DATE: ***    PATIENT EDUCATION:  Education details: *** Person educated: {Person educated:25204} Education method: {Education Method:25205} Education comprehension: {Education Comprehension:25206}  HOME EXERCISE PROGRAM: ***  ASSESSMENT:  CLINICAL IMPRESSION: Patient is a *** y.o. *** who was seen today for physical therapy evaluation and treatment for ***.   OBJECTIVE IMPAIRMENTS: {opptimpairments:25111}.   ACTIVITY LIMITATIONS: {activitylimitations:27494}  PARTICIPATION LIMITATIONS: {participationrestrictions:25113}  PERSONAL FACTORS: {Personal factors:25162} are also affecting patient's functional outcome.    REHAB POTENTIAL: {rehabpotential:25112}  CLINICAL DECISION MAKING: {clinical decision making:25114}  EVALUATION COMPLEXITY: {Evaluation complexity:25115}   GOALS: Goals reviewed with patient? {yes/no:20286}  SHORT TERM GOALS: Target date: *** *** Baseline: Goal status: INITIAL  2.  *** Baseline:  Goal status: INITIAL  3.  *** Baseline:  Goal status: INITIAL  4.  *** Baseline:  Goal status: INITIAL  5.  *** Baseline:  Goal status: INITIAL  6.  *** Baseline:  Goal status: INITIAL  LONG TERM GOALS: Target date: ***  *** Baseline:  Goal status: INITIAL  2.  *** Baseline:  Goal status: INITIAL  3.  *** Baseline:  Goal status: INITIAL  4.  *** Baseline:  Goal status: INITIAL  5.  *** Baseline:  Goal status: INITIAL  6.  *** Baseline:  Goal status: INITIAL   PLAN:  PT FREQUENCY: {rehab frequency:25116}  PT DURATION: {rehab duration:25117}  PLANNED INTERVENTIONS: {rehab planned interventions:25118::97110-Therapeutic exercises,97530- Therapeutic [phone]- Neuromuscular re-education,97535- Self Rjmz,02859- Manual therapy}  PLAN FOR NEXT SESSION: ***   Almetta Fam, PT 04/06/2024, 10:07 AM

## 2024-04-19 ENCOUNTER — Ambulatory Visit

## 2024-04-19 DIAGNOSIS — M25561 Pain in right knee: Secondary | ICD-10-CM | POA: Diagnosis not present

## 2024-04-19 DIAGNOSIS — S8391XD Sprain of unspecified site of right knee, subsequent encounter: Secondary | ICD-10-CM | POA: Diagnosis not present

## 2024-04-19 DIAGNOSIS — M6281 Muscle weakness (generalized): Secondary | ICD-10-CM

## 2024-04-19 NOTE — Therapy (Signed)
 OUTPATIENT PHYSICAL THERAPY LOWER EXTREMITY EVALUATION   Patient Name: Rickey Lee MRN: [id number] DOB:March 22, 1981, 43 y.o., male Today's Date: 04/19/2024  END OF SESSION:  PT End of Session - 04/19/24 1605     Visit Number 1    Date for PT Re-Evaluation 06/28/24    PT Start Time 1605    PT Stop Time 1645    PT Time Calculation (min) 40 min          History reviewed. No pertinent past medical history. Past Surgical History:  Procedure Laterality Date   SHOULDER SURGERY     There are no active problems to display for this patient.   PCP: Norleen Jungling  REFERRING PROVIDER: Ozell Bruch  REFERRING DIAG: Rt knee pain   THERAPY DIAG:  Acute pain of right knee  Muscle weakness (generalized)  Sprain of right knee, unspecified ligament, subsequent encounter  Rationale for Evaluation and Treatment: Rehabilitation  ONSET DATE: 02/15/24  SUBJECTIVE:   SUBJECTIVE STATEMENT: The knee was bad and then it wasn't. Sometimes I have shooting pain in my lower leg and in my hip. Driving sucks. My issue is that it feels like a lingering problem that won't fully go away.   PERTINENT HISTORY: Presents to the emergency for evaluation of right knee injury.  Patient reports that he tripped over his dog and fell.  There was a twisting motion and then he landed on the right knee.  He is having pain on the outside portion of the knee and has difficulty bearing weight.  No other injury.   PAIN:  Are you having pain? Yes: NPRS scale: 4-5/10 Pain location: R knee, sometimes the R hip Pain description: stabbing, shooting  Aggravating factors: walking too much, stairs  Relieving factors: no not really, I just roll with it, knee brace   PRECAUTIONS: None  RED FLAGS: None   WEIGHT BEARING RESTRICTIONS: No  FALLS:  Has patient fallen in last 6 months? Yes. Number of falls 1  LIVING ENVIRONMENT: Lives with: lives with their family Lives in: House/apartment Stairs: Yes: Internal:  full flight inside steps; on right going up  OCCUPATION: works in [employer] as a Merchandiser, retail, on his feet a lot on concrete floors  PLOF: Independent  PATIENT GOALS: to not be in pain  NEXT MD VISIT:   OBJECTIVE:  Note: Objective measures were completed at Evaluation unless otherwise noted.  DIAGNOSTIC FINDINGS:  Mild prepatellar edema. Otherwise, unremarkable MRI of the right knee.  X-ray- No evidence of fracture, dislocation, or joint effusion. No evidence of arthropathy or other focal bone abnormality. Soft tissues are unremarkable.  PATIENT SURVEYS:  LEFS 41/80  COGNITION: Overall cognitive status: Within functional limits for tasks assessed     SENSATION: WFL  MUSCLE LENGTH: Hamstrings: mild tightness  POSTURE: No Significant postural limitations   LOWER EXTREMITY ROM: pain on RLE with hip flexion, knee flexion and ext but [facility]    LOWER EXTREMITY MMT:  MMT Right eval Left eval  Hip flexion 4+ 5  Hip extension    Hip abduction 4+ 5  Hip adduction    Hip internal rotation    Hip external rotation    Knee flexion 4+ 5  Knee extension 4+ 5  Ankle dorsiflexion    Ankle plantarflexion    Ankle inversion    Ankle eversion     (Blank rows = not tested)   FUNCTIONAL TESTS:  5 times sit to stand: 21s  TREATMENT DATE:  04/19/24- EVAL    PATIENT EDUCATION:  Education details: POC, HEP Person educated: Patient Education method: Medical illustrator Education comprehension: verbalized understanding and returned demonstration  HOME EXERCISE PROGRAM: Access Code: [email or web address] URL: [email or web address] Date: 04/19/2024 Prepared by: Almetta Fam  Exercises - Side Stepping with Resistance at Ankles  - 1 x daily - 7 x weekly - 3 sets - 10 reps - Sit to Stand  - 1 x daily - 7 x weekly - 3 sets - 10 reps -  Standing 3-Way Leg Reach with Resistance at Ankles and Counter Support  - 1 x daily - 7 x weekly - 3 sets - 10 reps  ASSESSMENT:  CLINICAL IMPRESSION: Patient is a 43 y.o. male who was seen today for physical therapy evaluation and treatment for R knee pain from a fall 02/15/24. He had a possible sprain of unspecified ligament. He still has some pain with weight bearing on the R side. He reports pain with driving and with stairs. He also has pain with prolonged sitting, standing, and walking. His images were clear. Patient has pain in his R hip and lateral knee. He does present with some weakness in the RLE. He will benefit from skilled PT to increase his tolerance with activity and be able to sit, stand, walk, and drive without pain and improve his QOL.   OBJECTIVE IMPAIRMENTS: difficulty walking, decreased strength, and pain.   ACTIVITY LIMITATIONS: sitting, standing, squatting, stairs, and locomotion level  PARTICIPATION LIMITATIONS: cleaning, driving, shopping, community activity, occupation, and yard work  PERSONAL FACTORS: Fitness, Past/current experiences, and Time since onset of injury/illness/exacerbation are also affecting patient's functional outcome.   REHAB POTENTIAL: Good  CLINICAL DECISION MAKING: Stable/uncomplicated  EVALUATION COMPLEXITY: Low  GOALS: Goals reviewed with patient? Yes  SHORT TERM GOALS: Target date: 05/24/24  Patient will be independent with initial HEP. Baseline:  Goal status: INITIAL  2.  Patient will complete STS <15s  Baseline: 21s Goal status: INITIAL    LONG TERM GOALS: Target date: 06/28/24  Patient will be independent with advanced/ongoing HEP to improve outcomes and carryover.  Baseline:  Goal status: INITIAL  2.  Patient will report at least 50-75% improvement in R knee and hip pain to improve QOL. Baseline: 5/10 Goal status: INITIAL  3.  Patient will be able to drive and sit long periods without pain Baseline: pain w/driving  any distance Goal status: INITIAL  4.  Patient will be able to ambulate long distances and up and down stairs without pain  Baseline:  Goal status: INITIAL  5.  Patient will increase LEFS score by at least 10 points to improve LE function.  Baseline: 41/80 Goal status: INITIAL   PLAN:  PT FREQUENCY: 2x/week  PT DURATION: 10 weeks  PLANNED INTERVENTIONS: 97110-Therapeutic exercises, 97530- Therapeutic activity, 97112- Neuromuscular re-education, 97535- Self Care, 02859- Manual therapy, 97016- Vasopneumatic device, 97035- Ultrasound, 79439 (1-2 muscles), 20561 (3+ muscles)- Dry Needling, Patient/Family education, Balance training, Stair training, Taping, Joint mobilization, Cryotherapy, and Moist heat *BCBS does not cover ionto*   PLAN FOR NEXT SESSION: RLE hip and knee strengthening, functional tasks such as step ups, sit to stands    [employer], PT 04/19/2024, 4:53 PM

## 2024-04-26 ENCOUNTER — Ambulatory Visit: Admitting: Physical Therapy

## 2024-04-28 ENCOUNTER — Encounter: Payer: Self-pay | Admitting: Physical Therapy

## 2024-04-28 ENCOUNTER — Ambulatory Visit: Admitting: Physical Therapy

## 2024-04-28 DIAGNOSIS — M6281 Muscle weakness (generalized): Secondary | ICD-10-CM

## 2024-04-28 DIAGNOSIS — S8391XD Sprain of unspecified site of right knee, subsequent encounter: Secondary | ICD-10-CM | POA: Diagnosis not present

## 2024-04-28 DIAGNOSIS — M25561 Pain in right knee: Secondary | ICD-10-CM

## 2024-04-28 NOTE — Therapy (Signed)
 OUTPATIENT PHYSICAL THERAPY LOWER EXTREMITY TREATMENT   Patient Name: Rickey Lee MRN: [id number] DOB:06-16-1981, 43 y.o., male Today's Date: 04/28/2024  END OF SESSION:  PT End of Session - 04/28/24 1517     Visit Number 2    Date for Recertification  06/28/24    PT Start Time 1518    PT Stop Time 1600    PT Time Calculation (min) 42 min    Activity Tolerance Patient tolerated treatment well    Behavior During Therapy [facility] for tasks assessed/performed          History reviewed. No pertinent past medical history. Past Surgical History:  Procedure Laterality Date   SHOULDER SURGERY     There are no active problems to display for this patient.   PCP: Norleen Jungling  REFERRING PROVIDER: Ozell Bruch  REFERRING DIAG: Rt knee pain   THERAPY DIAG:  Acute pain of right knee  Muscle weakness (generalized)  Sprain of right knee, unspecified ligament, subsequent encounter  Rationale for Evaluation and Treatment: Rehabilitation  ONSET DATE: 02/15/24  SUBJECTIVE:   SUBJECTIVE STATEMENT:   Knee is fine, stabbing pain in his hip more recently   The knee was bad and then it wasn't. Sometimes I have shooting pain in my lower leg and in my hip. Driving sucks. My issue is that it feels like a lingering problem that won't fully go away.   PERTINENT HISTORY: Presents to the emergency for evaluation of right knee injury.  Patient reports that he tripped over his dog and fell.  There was a twisting motion and then he landed on the right knee.  He is having pain on the outside portion of the knee and has difficulty bearing weight.  No other injury.   PAIN:  Are you having pain? Yes: NPRS scale: 3-4/10 Pain location: R hip Pain description: stabbing, shooting  Aggravating factors: walking too much, stairs  Relieving factors: no not really, I just roll with it, knee brace   PRECAUTIONS: None  RED FLAGS: None   WEIGHT BEARING RESTRICTIONS: No  FALLS:  Has patient fallen  in last 6 months? Yes. Number of falls 1  LIVING ENVIRONMENT: Lives with: lives with their family Lives in: House/apartment Stairs: Yes: Internal: full flight inside steps; on right going up  OCCUPATION: works in [employer] as a Merchandiser, retail, on his feet a lot on concrete floors  PLOF: Independent  PATIENT GOALS: to not be in pain  NEXT MD VISIT:   OBJECTIVE:  Note: Objective measures were completed at Evaluation unless otherwise noted.  DIAGNOSTIC FINDINGS:  Mild prepatellar edema. Otherwise, unremarkable MRI of the right knee.  X-ray- No evidence of fracture, dislocation, or joint effusion. No evidence of arthropathy or other focal bone abnormality. Soft tissues are unremarkable.  PATIENT SURVEYS:  LEFS 41/80  COGNITION: Overall cognitive status: Within functional limits for tasks assessed     SENSATION: WFL  MUSCLE LENGTH: Hamstrings: mild tightness  POSTURE: No Significant postural limitations   LOWER EXTREMITY ROM: pain on RLE with hip flexion, knee flexion and ext but [facility]    LOWER EXTREMITY MMT:  MMT Right eval Left eval  Hip flexion 4+ 5  Hip extension    Hip abduction 4+ 5  Hip adduction    Hip internal rotation    Hip external rotation    Knee flexion 4+ 5  Knee extension 4+ 5  Ankle dorsiflexion    Ankle plantarflexion    Ankle inversion    Ankle eversion     (  Blank rows = not tested)   FUNCTIONAL TESTS:  5 times sit to stand: 21s                                                                                                                                TREATMENT DATE:  04/28/24 Bike L 4 x 6 min 30lb resisted gait 4 way x3 each Forward and lateral step ups 6in x 10 each HS curls 25lb 2x10  Leg Ext 5lb 2x10 Passive stretching to HS, piriformis, ITB, Glute, K2C  STM to R ITB  04/19/24- EVAL    PATIENT EDUCATION:  Education details: POC, HEP Person educated: Patient Education method: Medical illustrator Education  comprehension: verbalized understanding and returned demonstration  HOME EXERCISE PROGRAM: Access Code: [email or web address] URL: [email or web address] Date: 04/19/2024 Prepared by: Almetta Fam  Exercises - Side Stepping with Resistance at Ankles  - 1 x daily - 7 x weekly - 3 sets - 10 reps - Sit to Stand  - 1 x daily - 7 x weekly - 3 sets - 10 reps - Standing 3-Way Leg Reach with Resistance at Ankles and Counter Support  - 1 x daily - 7 x weekly - 3 sets - 10 reps  ASSESSMENT:  CLINICAL IMPRESSION: Patient is a 43 y.o. male who was seen today for physical therapy treatment for R knee pain from a fall 02/15/24. He had a possible sprain of unspecified ligament. He enters with the chief complaint of R hip pain. Some discomfort with resisted gait and step ups . Increase lateral knee and hip pain with curls and ext. Increase hip pain reported with HS stretch and and STM to ITB.  He will benefit from skilled PT to increase his tolerance with activity and be able to sit, stand, walk, and drive without pain and improve his QOL.   OBJECTIVE IMPAIRMENTS: difficulty walking, decreased strength, and pain.   ACTIVITY LIMITATIONS: sitting, standing, squatting, stairs, and locomotion level  PARTICIPATION LIMITATIONS: cleaning, driving, shopping, community activity, occupation, and yard work  PERSONAL FACTORS: Fitness, Past/current experiences, and Time since onset of injury/illness/exacerbation are also affecting patient's functional outcome.   REHAB POTENTIAL: Good  CLINICAL DECISION MAKING: Stable/uncomplicated  EVALUATION COMPLEXITY: Low  GOALS: Goals reviewed with patient? Yes  SHORT TERM GOALS: Target date: 05/24/24  Patient will be independent with initial HEP. Baseline:  Goal status: INITIAL  2.  Patient will complete STS <15s  Baseline: 21s Goal status: INITIAL    LONG TERM GOALS: Target date: 06/28/24  Patient will be independent with advanced/ongoing HEP to improve  outcomes and carryover.  Baseline:  Goal status: INITIAL  2.  Patient will report at least 50-75% improvement in R knee and hip pain to improve QOL. Baseline: 5/10 Goal status: INITIAL  3.  Patient will be able to drive and sit long periods without pain Baseline: pain w/driving any distance Goal status: INITIAL  4.  Patient will be able to ambulate  long distances and up and down stairs without pain  Baseline:  Goal status: INITIAL  5.  Patient will increase LEFS score by at least 10 points to improve LE function.  Baseline: 41/80 Goal status: INITIAL   PLAN:  PT FREQUENCY: 2x/week  PT DURATION: 10 weeks  PLANNED INTERVENTIONS: 97110-Therapeutic exercises, 97530- Therapeutic activity, 97112- Neuromuscular re-education, 97535- Self Care, 02859- Manual therapy, 97016- Vasopneumatic device, 97035- Ultrasound, 79439 (1-2 muscles), 20561 (3+ muscles)- Dry Needling, Patient/Family education, Balance training, Stair training, Taping, Joint mobilization, Cryotherapy, and Moist heat *BCBS does not cover ionto*   PLAN FOR NEXT SESSION: RLE hip and knee strengthening, functional tasks such as step ups, sit to stands   Tanda KANDICE Sorrow, PTA 04/28/2024, 3:19 PM

## 2024-05-03 ENCOUNTER — Ambulatory Visit: Admitting: Physical Therapy

## 2024-05-03 ENCOUNTER — Encounter: Payer: Self-pay | Admitting: Physical Therapy

## 2024-05-03 DIAGNOSIS — M25561 Pain in right knee: Secondary | ICD-10-CM | POA: Diagnosis not present

## 2024-05-03 DIAGNOSIS — S8391XD Sprain of unspecified site of right knee, subsequent encounter: Secondary | ICD-10-CM

## 2024-05-03 DIAGNOSIS — M6281 Muscle weakness (generalized): Secondary | ICD-10-CM

## 2024-05-03 NOTE — Therapy (Signed)
 OUTPATIENT PHYSICAL THERAPY LOWER EXTREMITY TREATMENT   Patient Name: Rickey Lee MRN: [id number] DOB:September 11, 1980, 43 y.o., male Today's Date: 05/03/2024  END OF SESSION:  PT End of Session - 05/03/24 1515     Visit Number 3    Date for Recertification  06/28/24    PT Start Time 1515    PT Stop Time 1600    PT Time Calculation (min) 45 min    Activity Tolerance Patient tolerated treatment well    Behavior During Therapy [facility] for tasks assessed/performed          History reviewed. No pertinent past medical history. Past Surgical History:  Procedure Laterality Date   SHOULDER SURGERY     There are no active problems to display for this patient.   PCP: Norleen Jungling  REFERRING PROVIDER: Ozell Bruch  REFERRING DIAG: Rt knee pain   THERAPY DIAG:  Acute pain of right knee  Muscle weakness (generalized)  Sprain of right knee, unspecified ligament, subsequent encounter  Rationale for Evaluation and Treatment: Rehabilitation  ONSET DATE: 02/15/24  SUBJECTIVE:   SUBJECTIVE STATEMENT:  R hip pain since last session. R knee pain yesterday   The knee was bad and then it wasn't. Sometimes I have shooting pain in my lower leg and in my hip. Driving sucks. My issue is that it feels like a lingering problem that won't fully go away.   PERTINENT HISTORY: Presents to the emergency for evaluation of right knee injury.  Patient reports that he tripped over his dog and fell.  There was a twisting motion and then he landed on the right knee.  He is having pain on the outside portion of the knee and has difficulty bearing weight.  No other injury.   PAIN:  Are you having pain? Yes: NPRS scale: 5-6/10 Pain location: R hip Pain description: stabbing, shooting  Aggravating factors: walking too much, stairs  Relieving factors: no not really, I just roll with it, knee brace   PRECAUTIONS: None  RED FLAGS: None   WEIGHT BEARING RESTRICTIONS: No  FALLS:  Has patient fallen  in last 6 months? Yes. Number of falls 1  LIVING ENVIRONMENT: Lives with: lives with their family Lives in: House/apartment Stairs: Yes: Internal: full flight inside steps; on right going up  OCCUPATION: works in [employer] as a Merchandiser, retail, on his feet a lot on concrete floors  PLOF: Independent  PATIENT GOALS: to not be in pain  NEXT MD VISIT:   OBJECTIVE:  Note: Objective measures were completed at Evaluation unless otherwise noted.  DIAGNOSTIC FINDINGS:  Mild prepatellar edema. Otherwise, unremarkable MRI of the right knee.  X-ray- No evidence of fracture, dislocation, or joint effusion. No evidence of arthropathy or other focal bone abnormality. Soft tissues are unremarkable.  PATIENT SURVEYS:  LEFS 41/80  COGNITION: Overall cognitive status: Within functional limits for tasks assessed     SENSATION: WFL  MUSCLE LENGTH: Hamstrings: mild tightness  POSTURE: No Significant postural limitations   LOWER EXTREMITY ROM: pain on RLE with hip flexion, knee flexion and ext but [facility]    LOWER EXTREMITY MMT:  MMT Right eval Left eval  Hip flexion 4+ 5  Hip extension    Hip abduction 4+ 5  Hip adduction    Hip internal rotation    Hip external rotation    Knee flexion 4+ 5  Knee extension 4+ 5  Ankle dorsiflexion    Ankle plantarflexion    Ankle inversion    Ankle eversion     (  Blank rows = not tested)   FUNCTIONAL TESTS:  5 times sit to stand: 21s                                                                                                                                TREATMENT DATE:  05/03/24 NuStep L 5 x 7 min  Sit to stand 2x10 LAQ 3lb 2x10 RLE HS curls RLE green 2x10 Brid[place] 2x10 Passive stretching to HS, piriformis, ITB, Glute, K2C  STM to R ITB, some with Thera gun   04/28/24 Bike L 4 x 6 min 30lb resisted gait 4 way x3 each Forward and lateral step ups 6in x 10 each HS curls 25lb 2x10  Leg Ext 5lb 2x10 Passive stretching to HS,  piriformis, ITB, Glute, K2C  STM to R ITB  04/19/24- EVAL    PATIENT EDUCATION:  Education details: POC, HEP Person educated: Patient Education method: Medical illustrator Education comprehension: verbalized understanding and returned demonstration  HOME EXERCISE PROGRAM: Access Code: [email or web address] URL: [email or web address] Date: 04/19/2024 Prepared by: Almetta Fam  Exercises - Side Stepping with Resistance at Ankles  - 1 x daily - 7 x weekly - 3 sets - 10 reps - Sit to Stand  - 1 x daily - 7 x weekly - 3 sets - 10 reps - Standing 3-Way Leg Reach with Resistance at Ankles and Counter Support  - 1 x daily - 7 x weekly - 3 sets - 10 reps  ASSESSMENT:  CLINICAL IMPRESSION: Patient is a 43 y.o. male who was seen today for physical therapy treatment for R knee pain from a fall 02/15/24. He had a possible sprain of unspecified ligament. He continues to complain more R hip pain than knee pain.  Pt reported increase R hip pain with both LAQ and HS curls.  Increase R hip pain reported with HS stretch and and STM to ITB.  Advised pt call MD hip MD and inform him of his symptoms.  He will benefit from skilled PT to increase his tolerance with activity and be able to sit, stand, walk, and drive without pain and improve his QOL.   OBJECTIVE IMPAIRMENTS: difficulty walking, decreased strength, and pain.   ACTIVITY LIMITATIONS: sitting, standing, squatting, stairs, and locomotion level  PARTICIPATION LIMITATIONS: cleaning, driving, shopping, community activity, occupation, and yard work  PERSONAL FACTORS: Fitness, Past/current experiences, and Time since onset of injury/illness/exacerbation are also affecting patient's functional outcome.   REHAB POTENTIAL: Good  CLINICAL DECISION MAKING: Stable/uncomplicated  EVALUATION COMPLEXITY: Low  GOALS: Goals reviewed with patient? Yes  SHORT TERM GOALS: Target date: 05/24/24  Patient will be independent with initial  HEP. Baseline:  Goal status: INITIAL  2.  Patient will complete STS <15s  Baseline: 21s Goal status: INITIAL    LONG TERM GOALS: Target date: 06/28/24  Patient will be independent with advanced/ongoing HEP to improve outcomes and carryover.  Baseline:  Goal status: INITIAL  2.  Patient  will report at least 50-75% improvement in R knee and hip pain to improve QOL. Baseline: 5/10 Goal status: INITIAL  3.  Patient will be able to drive and sit long periods without pain Baseline: pain w/driving any distance Goal status: INITIAL  4.  Patient will be able to ambulate long distances and up and down stairs without pain  Baseline:  Goal status: INITIAL  5.  Patient will increase LEFS score by at least 10 points to improve LE function.  Baseline: 41/80 Goal status: INITIAL   PLAN:  PT FREQUENCY: 2x/week  PT DURATION: 10 weeks  PLANNED INTERVENTIONS: 97110-Therapeutic exercises, 97530- Therapeutic activity, 97112- Neuromuscular re-education, 97535- Self Care, 02859- Manual therapy, 97016- Vasopneumatic device, 97035- Ultrasound, 79439 (1-2 muscles), 20561 (3+ muscles)- Dry Needling, Patient/Family education, Balance training, Stair training, Taping, Joint mobilization, Cryotherapy, and Moist heat *BCBS does not cover ionto*   PLAN FOR NEXT SESSION: RLE hip and knee strengthening, functional tasks such as step ups, sit to stands   Tanda KANDICE Sorrow, PTA 05/03/2024, 3:15 PM

## 2024-05-05 ENCOUNTER — Ambulatory Visit: Admitting: Physical Therapy

## 2024-05-05 DIAGNOSIS — M7631 Iliotibial band syndrome, right leg: Secondary | ICD-10-CM | POA: Diagnosis not present

## 2024-05-05 DIAGNOSIS — S83241D Other tear of medial meniscus, current injury, right knee, subsequent encounter: Secondary | ICD-10-CM | POA: Diagnosis not present

## 2024-05-10 ENCOUNTER — Ambulatory Visit: Attending: Orthopedic Surgery

## 2024-05-10 ENCOUNTER — Other Ambulatory Visit: Payer: Self-pay

## 2024-05-10 DIAGNOSIS — M25561 Pain in right knee: Secondary | ICD-10-CM | POA: Diagnosis not present

## 2024-05-10 DIAGNOSIS — S8391XD Sprain of unspecified site of right knee, subsequent encounter: Secondary | ICD-10-CM | POA: Insufficient documentation

## 2024-05-10 DIAGNOSIS — M6281 Muscle weakness (generalized): Secondary | ICD-10-CM | POA: Diagnosis not present

## 2024-05-10 NOTE — Therapy (Signed)
 OUTPATIENT PHYSICAL THERAPY LOWER EXTREMITY TREATMENT   Patient Name: Rickey Lee MRN: [id number] DOB:1981/04/17, 43 y.o., male Today's Date: 05/10/2024  END OF SESSION:  PT End of Session - 05/10/24 1518     Visit Number 4    Date for Recertification  06/28/24    PT Start Time 1515    PT Stop Time 1600    PT Time Calculation (min) 45 min    Activity Tolerance Patient tolerated treatment well    Behavior During Therapy [facility] for tasks assessed/performed           History reviewed. No pertinent past medical history. Past Surgical History:  Procedure Laterality Date   SHOULDER SURGERY     There are no active problems to display for this patient.   PCP: Norleen Jungling  REFERRING PROVIDER: Ozell Bruch  REFERRING DIAG: Rt knee pain   THERAPY DIAG:  Acute pain of right knee  Muscle weakness (generalized)  Sprain of right knee, unspecified ligament, subsequent encounter  Rationale for Evaluation and Treatment: Rehabilitation  ONSET DATE: 02/15/24  SUBJECTIVE:   SUBJECTIVE STATEMENT:  Drove to [place]  and back this weekend, driver really hurts, seems to hurt under R knee cap and lat thigh, IT band.   The knee was bad and then it wasn't. Sometimes I have shooting pain in my lower leg and in my hip. Driving sucks. My issue is that it feels like a lingering problem that won't fully go away.   PERTINENT HISTORY: Presents to the emergency for evaluation of right knee injury.  Patient reports that he tripped over his dog and fell.  There was a twisting motion and then he landed on the right knee.  He is having pain on the outside portion of the knee and has difficulty bearing weight.  No other injury.   PAIN:  Are you having pain? Yes: NPRS scale: 5-6/10 Pain location: R hip Pain description: stabbing, shooting  Aggravating factors: walking too much, stairs  Relieving factors: no not really, I just roll with it, knee brace   PRECAUTIONS: None  RED  FLAGS: None   WEIGHT BEARING RESTRICTIONS: No  FALLS:  Has patient fallen in last 6 months? Yes. Number of falls 1  LIVING ENVIRONMENT: Lives with: lives with their family Lives in: House/apartment Stairs: Yes: Internal: full flight inside steps; on right going up  OCCUPATION: works in [employer] as a Merchandiser, retail, on his feet a lot on concrete floors  PLOF: Independent  PATIENT GOALS: to not be in pain  NEXT MD VISIT:   OBJECTIVE:  Note: Objective measures were completed at Evaluation unless otherwise noted.  DIAGNOSTIC FINDINGS:  Mild prepatellar edema. Otherwise, unremarkable MRI of the right knee.  X-ray- No evidence of fracture, dislocation, or joint effusion. No evidence of arthropathy or other focal bone abnormality. Soft tissues are unremarkable.  PATIENT SURVEYS:  LEFS 41/80  COGNITION: Overall cognitive status: Within functional limits for tasks assessed     SENSATION: WFL  MUSCLE LENGTH: Hamstrings: mild tightness  POSTURE: No Significant postural limitations   LOWER EXTREMITY ROM: pain on RLE with hip flexion, knee flexion and ext but [facility]    LOWER EXTREMITY MMT:  MMT Right eval Left eval  Hip flexion 4+ 5  Hip extension    Hip abduction 4+ 5  Hip adduction    Hip internal rotation    Hip external rotation    Knee flexion 4+ 5  Knee extension 4+ 5  Ankle dorsiflexion  Ankle plantarflexion    Ankle inversion    Ankle eversion     (Blank rows = not tested)   FUNCTIONAL TESTS:  5 times sit to stand: 21s                                                                                                                                TREATMENT DATE:  05/10/24: Nustep L 5 x 6 min Theragun R hamstrings distal  Clam shells R with 5 # cuff wt R knee Hamstring curls B flexion, 25, eccentric raising R only  Seated ball squeezes between heels with heel raisesto isolate lateral musculature lower lateral legs Seated heel raises with black t  band around ankles  Standing RDL's initially with B LE's, then with weight on R , toe down L Kinesiotape to lift and assist patella R in maintaining tracking.  05/03/24 NuStep L 5 x 7 min  Sit to stand 2x10 LAQ 3lb 2x10 RLE HS curls RLE green 2x10 Bridges 2x10 Passive stretching to HS, piriformis, ITB, Glute, K2C  STM to R ITB, some with Thera gun   04/28/24 Bike L 4 x 6 min 30lb resisted gait 4 way x3 each Forward and lateral step ups 6in x 10 each HS curls 25lb 2x10  Leg Ext 5lb 2x10 Passive stretching to HS, piriformis, ITB, Glute, K2C  STM to R ITB  04/19/24- EVAL    PATIENT EDUCATION:  Education details: POC, HEP Person educated: Patient Education method: Medical illustrator Education comprehension: verbalized understanding and returned demonstration  HOME EXERCISE PROGRAM: Access Code: [email or web address] URL: [email or web address] Date: 04/19/2024 Prepared by: Almetta Fam  Exercises - Side Stepping with Resistance at Ankles  - 1 x daily - 7 x weekly - 3 sets - 10 reps - Sit to Stand  - 1 x daily - 7 x weekly - 3 sets - 10 reps - Standing 3-Way Leg Reach with Resistance at Ankles and Counter Support  - 1 x daily - 7 x weekly - 3 sets - 10 reps  ASSESSMENT:  CLINICAL IMPRESSION: Patient is a 43 y.o. male who participated today with physical therapy treatment for R knee pain from a fall 02/15/24. He had a possible sprain of unspecified ligament. He continues to complain more R hip pain than knee pain.  We tried to address his hamstring strength, eccentric as well as reduce soft tissue irritation hamstrings. Tolerated well, fatigued but no reported increased pain at end of session.   He will benefit from skilled PT to increase his tolerance with activity and be able to sit, stand, walk, and drive without pain and improve his QOL.   OBJECTIVE IMPAIRMENTS: difficulty walking, decreased strength, and pain.   ACTIVITY LIMITATIONS: sitting, standing,  squatting, stairs, and locomotion level  PARTICIPATION LIMITATIONS: cleaning, driving, shopping, community activity, occupation, and yard work  PERSONAL FACTORS: Fitness, Past/current experiences, and Time since onset of injury/illness/exacerbation are also affecting patient's functional outcome.  REHAB POTENTIAL: Good  CLINICAL DECISION MAKING: Stable/uncomplicated  EVALUATION COMPLEXITY: Low  GOALS: Goals reviewed with patient? Yes  SHORT TERM GOALS: Target date: 05/24/24  Patient will be independent with initial HEP. Baseline:  Goal status: INITIAL  2.  Patient will complete STS <15s  Baseline: 21s Goal status: INITIAL    LONG TERM GOALS: Target date: 06/28/24  Patient will be independent with advanced/ongoing HEP to improve outcomes and carryover.  Baseline:  Goal status: INITIAL  2.  Patient will report at least 50-75% improvement in R knee and hip pain to improve QOL. Baseline: 5/10 Goal status: INITIAL  3.  Patient will be able to drive and sit long periods without pain Baseline: pain w/driving any distance Goal status: INITIAL  4.  Patient will be able to ambulate long distances and up and down stairs without pain  Baseline:  Goal status: INITIAL  5.  Patient will increase LEFS score by at least 10 points to improve LE function.  Baseline: 41/80 Goal status: INITIAL   PLAN:  PT FREQUENCY: 2x/week  PT DURATION: 10 weeks  PLANNED INTERVENTIONS: 97110-Therapeutic exercises, 97530- Therapeutic activity, [id number]- Neuromuscular re-education, 97535- Self Care, [id number]- Manual therapy, 97016- Vasopneumatic device, [id number]- Ultrasound, 79439 (1-2 muscles), 20561 (3+ muscles)- Dry Needling, Patient/Family education, Balance training, Stair training, Taping, Joint mobilization, Cryotherapy, and Moist heat *BCBS does not cover ionto*   PLAN FOR NEXT SESSION: RLE hip and knee strengthening, functional tasks such as step ups, sit to stands   Khaleah Duer L Azara Gemme, PT, DPT,  OCS 05/10/2024, 3:20 PM

## 2024-05-12 ENCOUNTER — Ambulatory Visit

## 2024-05-12 ENCOUNTER — Other Ambulatory Visit: Payer: Self-pay

## 2024-05-12 DIAGNOSIS — M25561 Pain in right knee: Secondary | ICD-10-CM | POA: Diagnosis not present

## 2024-05-12 DIAGNOSIS — S8391XD Sprain of unspecified site of right knee, subsequent encounter: Secondary | ICD-10-CM | POA: Diagnosis not present

## 2024-05-12 DIAGNOSIS — M6281 Muscle weakness (generalized): Secondary | ICD-10-CM

## 2024-05-12 NOTE — Therapy (Signed)
 OUTPATIENT PHYSICAL THERAPY LOWER EXTREMITY TREATMENT   Patient Name: Rickey Lee MRN: [id number] DOB:October 08, 1980, 43 y.o., male Today's Date: 05/12/2024  END OF SESSION:  PT End of Session - 05/12/24 1510     Visit Number 5    Date for Recertification  06/28/24    PT Start Time 1510    PT Stop Time 1555    PT Time Calculation (min) 45 min    Activity Tolerance Patient tolerated treatment well    Behavior During Therapy [facility] for tasks assessed/performed            History reviewed. No pertinent past medical history. Past Surgical History:  Procedure Laterality Date   SHOULDER SURGERY     There are no active problems to display for this patient.   PCP: Norleen Jungling  REFERRING PROVIDER: Ozell Bruch  REFERRING DIAG: Rt knee pain   THERAPY DIAG:  Acute pain of right knee  Muscle weakness (generalized)  Sprain of right knee, unspecified ligament, subsequent encounter  Rationale for Evaluation and Treatment: Rehabilitation  ONSET DATE: 02/15/24  SUBJECTIVE:   SUBJECTIVE STATEMENT: The tape seemed to really help, came off yesterday.  I worked last night all night.   The knee was bad and then it wasn't. Sometimes I have shooting pain in my lower leg and in my hip. Driving sucks. My issue is that it feels like a lingering problem that won't fully go away.   PERTINENT HISTORY: Presents to the emergency for evaluation of right knee injury.  Patient reports that he tripped over his dog and fell.  There was a twisting motion and then he landed on the right knee.  He is having pain on the outside portion of the knee and has difficulty bearing weight.  No other injury.   PAIN:  Are you having pain? Yes: NPRS scale: 5-6/10 Pain location: R hip Pain description: stabbing, shooting  Aggravating factors: walking too much, stairs  Relieving factors: no not really, I just roll with it, knee brace   PRECAUTIONS: None  RED FLAGS: None   WEIGHT BEARING RESTRICTIONS:  No  FALLS:  Has patient fallen in last 6 months? Yes. Number of falls 1  LIVING ENVIRONMENT: Lives with: lives with their family Lives in: House/apartment Stairs: Yes: Internal: full flight inside steps; on right going up  OCCUPATION: works in [employer] as a Merchandiser, retail, on his feet a lot on concrete floors  PLOF: Independent  PATIENT GOALS: to not be in pain  NEXT MD VISIT:   OBJECTIVE:  Note: Objective measures were completed at Evaluation unless otherwise noted.  DIAGNOSTIC FINDINGS:  Mild prepatellar edema. Otherwise, unremarkable MRI of the right knee.  X-ray- No evidence of fracture, dislocation, or joint effusion. No evidence of arthropathy or other focal bone abnormality. Soft tissues are unremarkable.  PATIENT SURVEYS:  LEFS 41/80  COGNITION: Overall cognitive status: Within functional limits for tasks assessed     SENSATION: WFL  MUSCLE LENGTH: Hamstrings: mild tightness  POSTURE: No Significant postural limitations   LOWER EXTREMITY ROM: pain on RLE with hip flexion, knee flexion and ext but [facility]    LOWER EXTREMITY MMT:  MMT Right eval Left eval  Hip flexion 4+ 5  Hip extension    Hip abduction 4+ 5  Hip adduction    Hip internal rotation    Hip external rotation    Knee flexion 4+ 5  Knee extension 4+ 5  Ankle dorsiflexion    Ankle plantarflexion    Ankle  inversion    Ankle eversion     (Blank rows = not tested)   FUNCTIONAL TESTS:  5 times sit to stand: 21s                                                                                                                                TREATMENT DATE:  05/12/24:  Recumbent cycle x 6 min level 3  Kinesiotaping 2 I pieces R infrapatellar tendon attachment, patella and to ant prox quads . Standing RDL's initially with B LE's, then with weight on R , toe down L 5# kettle bell 12 reps Side lying L for clam shell R 5# cuff wt  Hamstring curls 35# B with eccentric raises R only 15 x  2 Leg press R Le only for terminal hip and knee ext 40#, 3 x 15 B calf raises on leg press 40#  Seated SLR with R LE ER 30 degrees Unilateral balancing on R for star reaches L LE  Ice after visit 10 min R lat hip   05/10/24: Nustep L 5 x 6 min Theragun R hamstrings distal  Clam shells R with 5 # cuff wt R knee Hamstring curls B flexion, 25, eccentric raising R only  Seated ball squeezes between heels with heel raisesto isolate lateral musculature lower lateral legs Seated heel raises with black t band around ankles  Standing RDL's initially with B LE's, then with weight on R , toe down L Kinesiotape to lift and assist patella R in maintaining tracking.  05/03/24 NuStep L 5 x 7 min  Sit to stand 2x10 LAQ 3lb 2x10 RLE HS curls RLE green 2x10 Bridges 2x10 Passive stretching to HS, piriformis, ITB, Glute, K2C  STM to R ITB, some with Thera gun   04/28/24 Bike L 4 x 6 min 30lb resisted gait 4 way x3 each Forward and lateral step ups 6in x 10 each HS curls 25lb 2x10  Leg Ext 5lb 2x10 Passive stretching to HS, piriformis, ITB, Glute, K2C  STM to R ITB  04/19/24- EVAL    PATIENT EDUCATION:  Education details: POC, HEP Person educated: Patient Education method: Medical illustrator Education comprehension: verbalized understanding and returned demonstration  HOME EXERCISE PROGRAM: Access Code: [email or web address] URL: [email or web address] Date: 04/19/2024 Prepared by: Almetta Fam  Exercises - Side Stepping with Resistance at Ankles  - 1 x daily - 7 x weekly - 3 sets - 10 reps - Sit to Stand  - 1 x daily - 7 x weekly - 3 sets - 10 reps - Standing 3-Way Leg Reach with Resistance at Ankles and Counter Support  - 1 x daily - 7 x weekly - 3 sets - 10 reps  ASSESSMENT:  CLINICAL IMPRESSION: Patient is a 43 y.o. male who participated today with physical therapy treatment for R knee pain from a fall 02/15/24. He had a possible sprain of unspecified ligament. He  continues to complain more R hip  pain than knee pain.  Continued to address hamstring strength, medial quads strength, and stability training.  Very weak, shaky with unilateral leg press, improved with repetition, with improved motor control.   He will benefit from skilled PT to increase his tolerance with activity and be able to sit, stand, walk, and drive without pain and improve his QOL.   OBJECTIVE IMPAIRMENTS: difficulty walking, decreased strength, and pain.   ACTIVITY LIMITATIONS: sitting, standing, squatting, stairs, and locomotion level  PARTICIPATION LIMITATIONS: cleaning, driving, shopping, community activity, occupation, and yard work  PERSONAL FACTORS: Fitness, Past/current experiences, and Time since onset of injury/illness/exacerbation are also affecting patient's functional outcome.   REHAB POTENTIAL: Good  CLINICAL DECISION MAKING: Stable/uncomplicated  EVALUATION COMPLEXITY: Low  GOALS: Goals reviewed with patient? Yes  SHORT TERM GOALS: Target date: 05/24/24  Patient will be independent with initial HEP. Baseline:  Goal status: INITIAL  2.  Patient will complete STS <15s  Baseline: 21s Goal status: INITIAL    LONG TERM GOALS: Target date: 06/28/24  Patient will be independent with advanced/ongoing HEP to improve outcomes and carryover.  Baseline:  Goal status: INITIAL  2.  Patient will report at least 50-75% improvement in R knee and hip pain to improve QOL. Baseline: 5/10 Goal status: INITIAL  3.  Patient will be able to drive and sit long periods without pain Baseline: pain w/driving any distance Goal status: INITIAL  4.  Patient will be able to ambulate long distances and up and down stairs without pain  Baseline:  Goal status: INITIAL  5.  Patient will increase LEFS score by at least 10 points to improve LE function.  Baseline: 41/80 Goal status: INITIAL   PLAN:  PT FREQUENCY: 2x/week  PT DURATION: 10 weeks  PLANNED INTERVENTIONS:  97110-Therapeutic exercises, 97530- Therapeutic activity, [id number]- Neuromuscular re-education, 97535- Self Care, 02859- Manual therapy, 97016- Vasopneumatic device, [id number]- Ultrasound, 79439 (1-2 muscles), 20561 (3+ muscles)- Dry Needling, Patient/Family education, Balance training, Stair training, Taping, Joint mobilization, Cryotherapy, and Moist heat *BC[employer] does not cover ionto*   PLAN FOR NEXT SESSION: RLE hip and knee strengthening, functional tasks such as step ups, sit to stands   Oria Klimas L Ashliegh Parekh, PT, DPT, OCS 05/12/2024, 4:03 PM

## 2024-05-17 ENCOUNTER — Ambulatory Visit: Admitting: Physical Therapy

## 2024-05-17 ENCOUNTER — Encounter: Payer: Self-pay | Admitting: Physical Therapy

## 2024-05-17 DIAGNOSIS — S8391XD Sprain of unspecified site of right knee, subsequent encounter: Secondary | ICD-10-CM

## 2024-05-17 DIAGNOSIS — M25561 Pain in right knee: Secondary | ICD-10-CM | POA: Diagnosis not present

## 2024-05-17 DIAGNOSIS — M6281 Muscle weakness (generalized): Secondary | ICD-10-CM | POA: Diagnosis not present

## 2024-05-17 NOTE — Therapy (Signed)
 OUTPATIENT PHYSICAL THERAPY LOWER EXTREMITY TREATMENT   Patient Name: Rickey Lee MRN: [id number] DOB:10/13/1980, 43 y.o., male Today's Date: 05/17/2024  END OF SESSION:  PT End of Session - 05/17/24 1544     Visit Number 6    Date for Recertification  06/28/24    PT Start Time 1015    PT Stop Time 1600    PT Time Calculation (min) 345 min    Activity Tolerance Patient tolerated treatment well    Behavior During Therapy [facility] for tasks assessed/performed            History reviewed. No pertinent past medical history. Past Surgical History:  Procedure Laterality Date   SHOULDER SURGERY     There are no active problems to display for this patient.   PCP: Norleen Jungling  REFERRING PROVIDER: Ozell Bruch  REFERRING DIAG: Rt knee pain   THERAPY DIAG:  Muscle weakness (generalized)  Sprain of right knee, unspecified ligament, subsequent encounter  Acute pain of right knee  Rationale for Evaluation and Treatment: Rehabilitation  ONSET DATE: 02/15/24  SUBJECTIVE:   SUBJECTIVE STATEMENT: Pretty good today,Tape did help   The knee was bad and then it wasn't. Sometimes I have shooting pain in my lower leg and in my hip. Driving sucks. My issue is that it feels like a lingering problem that won't fully go away.   PERTINENT HISTORY: Presents to the emergency for evaluation of right knee injury.  Patient reports that he tripped over his dog and fell.  There was a twisting motion and then he landed on the right knee.  He is having pain on the outside portion of the knee and has difficulty bearing weight.  No other injury.   PAIN:  Are you having pain? Yes: NPRS scale: 2/10 Pain location: R hip Pain description: stabbing, shooting  Aggravating factors: walking too much, stairs  Relieving factors: no not really, I just roll with it, knee brace   PRECAUTIONS: None  RED FLAGS: None   WEIGHT BEARING RESTRICTIONS: No  FALLS:  Has patient fallen in last 6 months?  Yes. Number of falls 1  LIVING ENVIRONMENT: Lives with: lives with their family Lives in: House/apartment Stairs: Yes: Internal: full flight inside steps; on right going up  OCCUPATION: works in [employer] as a Merchandiser, retail, on his feet a lot on concrete floors  PLOF: Independent  PATIENT GOALS: to not be in pain  NEXT MD VISIT:   OBJECTIVE:  Note: Objective measures were completed at Evaluation unless otherwise noted.  DIAGNOSTIC FINDINGS:  Mild prepatellar edema. Otherwise, unremarkable MRI of the right knee.  X-ray- No evidence of fracture, dislocation, or joint effusion. No evidence of arthropathy or other focal bone abnormality. Soft tissues are unremarkable.  PATIENT SURVEYS:  LEFS 41/80  COGNITION: Overall cognitive status: Within functional limits for tasks assessed     SENSATION: WFL  MUSCLE LENGTH: Hamstrings: mild tightness  POSTURE: No Significant postural limitations   LOWER EXTREMITY ROM: pain on RLE with hip flexion, knee flexion and ext but [facility]    LOWER EXTREMITY MMT:  MMT Right eval Left eval  Hip flexion 4+ 5  Hip extension    Hip abduction 4+ 5  Hip adduction    Hip internal rotation    Hip external rotation    Knee flexion 4+ 5  Knee extension 4+ 5  Ankle dorsiflexion    Ankle plantarflexion    Ankle inversion    Ankle eversion     (Blank  rows = not tested)   FUNCTIONAL TESTS:  5 times sit to stand: 21s                                                                                                                                TREATMENT DATE:  05/17/24 Recumbent cycle x 6 min level 3  Kinesiotaping 2 I pieces R infrapatellar tendon attachment, patella and to ant prox quads . Passive stretching to HS, piriformis, glute, K2X, lower trunk rotation  Bridges x10  W/ ball squeeze x10  Black band abd x10 Sit to stands holding blue ball 2x10  05/12/24:  Recumbent cycle x 6 min level 3  Kinesiotaping 2 I pieces R  infrapatellar tendon attachment, patella and to ant prox quads . Standing RDL's initially with B LE's, then with weight on R , toe down L 5# kettle bell 12 reps Side lying L for clam shell R 5# cuff wt  Hamstring curls 35# B with eccentric raises R only 15 x 2 Leg press R Le only for terminal hip and knee ext 40#, 3 x 15 B calf raises on leg press 40#  Seated SLR with R LE ER 30 degrees Unilateral balancing on R for star reaches L LE  Ice after visit 10 min R lat hip   05/10/24: Nustep L 5 x 6 min Theragun R hamstrings distal  Clam shells R with 5 # cuff wt R knee Hamstring curls B flexion, 25, eccentric raising R only  Seated ball squeezes between heels with heel raisesto isolate lateral musculature lower lateral legs Seated heel raises with black t band around ankles  Standing RDL's initially with B LE's, then with weight on R , toe down L Kinesiotape to lift and assist patella R in maintaining tracking.  05/03/24 NuStep L 5 x 7 min  Sit to stand 2x10 LAQ 3lb 2x10 RLE HS curls RLE green 2x10 Bridges 2x10 Passive stretching to HS, piriformis, ITB, Glute, K2C  STM to R ITB, some with Thera gun   04/28/24 Bike L 4 x 6 min 30lb resisted gait 4 way x3 each Forward and lateral step ups 6in x 10 each HS curls 25lb 2x10  Leg Ext 5lb 2x10 Passive stretching to HS, piriformis, ITB, Glute, K2C  STM to R ITB  04/19/24- EVAL    PATIENT EDUCATION:  Education details: POC, HEP Person educated: Patient Education method: Medical illustrator Education comprehension: verbalized understanding and returned demonstration  HOME EXERCISE PROGRAM: Access Code: [email or web address] URL: [email or web address] Date: 04/19/2024 Prepared by: Almetta Fam  Exercises - Side Stepping with Resistance at Ankles  - 1 x daily - 7 x weekly - 3 sets - 10 reps - Sit to Stand  - 1 x daily - 7 x weekly - 3 sets - 10 reps - Standing 3-Way Leg Reach with Resistance at Ankles and Counter Support   - 1 x daily - 7 x weekly - 3 sets -  10 reps  ASSESSMENT:  CLINICAL IMPRESSION: Patient is a 43 y.o. male who participated today with physical therapy treatment for R knee pain from a fall 02/15/24. He had a possible sprain of unspecified ligament. This week its been more knee pain that originates from R hip and goes down. Continued to address hamstring strength, medial quads strength, and stability training.  Very weak, shaky with bridges and with the variations. R glute and ITB tightness with stretching.   He will benefit from skilled PT to increase his tolerance with activity and be able to sit, stand, walk, and drive without pain and improve his QOL.   OBJECTIVE IMPAIRMENTS: difficulty walking, decreased strength, and pain.   ACTIVITY LIMITATIONS: sitting, standing, squatting, stairs, and locomotion level  PARTICIPATION LIMITATIONS: cleaning, driving, shopping, community activity, occupation, and yard work  PERSONAL FACTORS: Fitness, Past/current experiences, and Time since onset of injury/illness/exacerbation are also affecting patient's functional outcome.   REHAB POTENTIAL: Good  CLINICAL DECISION MAKING: Stable/uncomplicated  EVALUATION COMPLEXITY: Low  GOALS: Goals reviewed with patient? Yes  SHORT TERM GOALS: Target date: 05/24/24  Patient will be independent with initial HEP. Baseline:  Goal status: INITIAL  2.  Patient will complete STS <15s  Baseline: 21s Goal status: INITIAL    LONG TERM GOALS: Target date: 06/28/24  Patient will be independent with advanced/ongoing HEP to improve outcomes and carryover.  Baseline:  Goal status: INITIAL  2.  Patient will report at least 50-75% improvement in R knee and hip pain to improve QOL. Baseline: 5/10 Goal status: INITIAL  3.  Patient will be able to drive and sit long periods without pain Baseline: pain w/driving any distance Goal status: INITIAL  4.  Patient will be able to ambulate long distances and up and  down stairs without pain  Baseline:  Goal status: INITIAL  5.  Patient will increase LEFS score by at least 10 points to improve LE function.  Baseline: 41/80 Goal status: INITIAL   PLAN:  PT FREQUENCY: 2x/week  PT DURATION: 10 weeks  PLANNED INTERVENTIONS: 97110-Therapeutic exercises, 97530- Therapeutic activity, 97112- Neuromuscular re-education, 97535- Self Care, 02859- Manual therapy, 97016- Vasopneumatic device, 97035- Ultrasound, 79439 (1-2 muscles), 20561 (3+ muscles)- Dry Needling, Patient/Family education, Balance training, Stair training, Taping, Joint mobilization, Cryotherapy, and Moist heat *BCBS does not cover ionto*   PLAN FOR NEXT SESSION: RLE hip and knee strengthening, functional tasks such as step ups, sit to stands   Tanda KANDICE Sorrow, PTA, DPT, OCS 05/17/2024, 3:44 PM

## 2024-05-19 ENCOUNTER — Ambulatory Visit: Admitting: Physical Therapy

## 2024-06-01 ENCOUNTER — Ambulatory Visit: Admitting: Physical Therapy

## 2024-08-14 ENCOUNTER — Other Ambulatory Visit: Payer: Self-pay

## 2024-08-14 ENCOUNTER — Encounter (HOSPITAL_COMMUNITY): Payer: Self-pay | Admitting: *Deleted

## 2024-08-14 ENCOUNTER — Emergency Department (HOSPITAL_COMMUNITY)

## 2024-08-14 ENCOUNTER — Emergency Department (HOSPITAL_COMMUNITY)
Admission: EM | Admit: 2024-08-14 | Discharge: 2024-08-14 | Disposition: A | Discharge status: Home / Self Care | Attending: Emergency Medicine | Admitting: Emergency Medicine

## 2024-08-14 DIAGNOSIS — S9001XA Contusion of right ankle, initial encounter: Secondary | ICD-10-CM | POA: Insufficient documentation

## 2024-08-14 DIAGNOSIS — M79662 Pain in left lower leg: Secondary | ICD-10-CM | POA: Diagnosis not present

## 2024-08-14 DIAGNOSIS — S12391A Other nondisplaced fracture of fourth cervical vertebra, initial encounter for closed fracture: Secondary | ICD-10-CM | POA: Diagnosis not present

## 2024-08-14 DIAGNOSIS — Y92410 Unspecified street and highway as the place of occurrence of the external cause: Secondary | ICD-10-CM | POA: Insufficient documentation

## 2024-08-14 DIAGNOSIS — S199XXA Unspecified injury of neck, initial encounter: Secondary | ICD-10-CM | POA: Diagnosis present

## 2024-08-14 MED ORDER — MORPHINE SULFATE (PF) 4 MG/ML IV SOLN
4.0000 mg | Freq: Once | INTRAVENOUS | Status: AC
  Administered 2024-08-14: 4 mg via INTRAMUSCULAR
  Filled 2024-08-14: qty 1

## 2024-08-14 MED ORDER — METHOCARBAMOL 500 MG PO TABS
500.0000 mg | ORAL_TABLET | Freq: Once | ORAL | Status: AC
  Administered 2024-08-14: 500 mg via ORAL
  Filled 2024-08-14: qty 1

## 2024-08-14 MED ORDER — OXYCODONE-ACETAMINOPHEN 5-325 MG PO TABS: 1.0000 | ORAL_TABLET | Freq: Four times a day (QID) | ORAL | 0 refills | Status: AC | PRN

## 2024-08-14 MED ORDER — LIDOCAINE 5 % EX PTCH: 1.0000 | MEDICATED_PATCH | CUTANEOUS | 0 refills | Status: AC

## 2024-08-14 MED ORDER — METHOCARBAMOL 500 MG PO TABS: 500.0000 mg | ORAL_TABLET | Freq: Two times a day (BID) | ORAL | 0 refills | Status: AC
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
## 2024-08-14 NOTE — ED Notes (Signed)
Ortho called 
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
## 2024-08-14 NOTE — Progress Notes (Signed)
 Orthopedic Tech Progress Note Patient Details:  Rickey Lee 11-18-1980 [id number]  Ortho Devices Type of Ortho Device: CAM walker, Crutches Ortho Device/Splint Location: RLE Ortho Device/Splint Interventions: Application   Post Interventions Patient Tolerated: Well Instructions Provided: Adjustment of device  [name] 08/14/2024, 12:14 PM
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
